# Patient Record
Sex: Male | Born: 1980 | Hispanic: Yes | Marital: Married | Smoking: Never smoker
Health system: Southern US, Community
[De-identification: ages and names within clinical notes are randomized; demographics above are authoritative.]

## PROBLEM LIST (undated history)

## (undated) DIAGNOSIS — I10 Essential (primary) hypertension: Secondary | ICD-10-CM

## (undated) DIAGNOSIS — E119 Type 2 diabetes mellitus without complications: Secondary | ICD-10-CM

---

## 2005-01-24 ENCOUNTER — Emergency Department: Payer: Self-pay | Admitting: Emergency Medicine

## 2005-02-15 ENCOUNTER — Emergency Department: Payer: Self-pay | Admitting: Emergency Medicine

## 2006-07-08 ENCOUNTER — Ambulatory Visit: Payer: Self-pay | Admitting: Family Medicine

## 2009-01-17 ENCOUNTER — Emergency Department: Payer: Self-pay | Admitting: Emergency Medicine

## 2014-03-20 ENCOUNTER — Ambulatory Visit: Payer: Self-pay | Admitting: Physician Assistant

## 2016-10-12 ENCOUNTER — Other Ambulatory Visit: Payer: Self-pay | Admitting: Orthopedic Surgery

## 2016-10-12 DIAGNOSIS — M25562 Pain in left knee: Secondary | ICD-10-CM

## 2016-10-18 ENCOUNTER — Other Ambulatory Visit: Payer: Self-pay | Admitting: Orthopedic Surgery

## 2016-10-18 DIAGNOSIS — M2392 Unspecified internal derangement of left knee: Secondary | ICD-10-CM

## 2016-10-18 DIAGNOSIS — M2352 Chronic instability of knee, left knee: Secondary | ICD-10-CM

## 2016-10-18 DIAGNOSIS — M25562 Pain in left knee: Secondary | ICD-10-CM

## 2016-10-22 ENCOUNTER — Ambulatory Visit
Admission: RE | Admit: 2016-10-22 | Discharge: 2016-10-22 | Disposition: A | Discharge status: Home / Self Care | Payer: PRIVATE HEALTH INSURANCE | Source: Ambulatory Visit | Attending: Orthopedic Surgery | Admitting: Orthopedic Surgery

## 2016-10-22 DIAGNOSIS — M2392 Unspecified internal derangement of left knee: Secondary | ICD-10-CM | POA: Diagnosis present

## 2016-10-22 DIAGNOSIS — M94262 Chondromalacia, left knee: Secondary | ICD-10-CM | POA: Diagnosis not present

## 2016-10-22 DIAGNOSIS — M2352 Chronic instability of knee, left knee: Secondary | ICD-10-CM

## 2016-10-22 DIAGNOSIS — M7138 Other bursal cyst, other site: Secondary | ICD-10-CM | POA: Insufficient documentation

## 2016-10-22 DIAGNOSIS — M25562 Pain in left knee: Secondary | ICD-10-CM

## 2016-12-03 DIAGNOSIS — M222X2 Patellofemoral disorders, left knee: Secondary | ICD-10-CM | POA: Insufficient documentation

## 2018-07-11 ENCOUNTER — Encounter: Payer: Self-pay | Admitting: *Deleted

## 2018-07-11 ENCOUNTER — Inpatient Hospital Stay
Admission: EM | Admit: 2018-07-11 | Discharge: 2018-07-15 | DRG: 392 | Disposition: A | Discharge status: Home / Self Care | Payer: PRIVATE HEALTH INSURANCE | Attending: Surgery | Admitting: Surgery

## 2018-07-11 ENCOUNTER — Other Ambulatory Visit: Payer: Self-pay

## 2018-07-11 ENCOUNTER — Emergency Department: Payer: PRIVATE HEALTH INSURANCE

## 2018-07-11 DIAGNOSIS — E119 Type 2 diabetes mellitus without complications: Secondary | ICD-10-CM | POA: Diagnosis present

## 2018-07-11 DIAGNOSIS — Z1159 Encounter for screening for other viral diseases: Secondary | ICD-10-CM

## 2018-07-11 DIAGNOSIS — K5732 Diverticulitis of large intestine without perforation or abscess without bleeding: Principal | ICD-10-CM | POA: Diagnosis present

## 2018-07-11 DIAGNOSIS — K5792 Diverticulitis of intestine, part unspecified, without perforation or abscess without bleeding: Secondary | ICD-10-CM | POA: Diagnosis not present

## 2018-07-11 DIAGNOSIS — R1032 Left lower quadrant pain: Secondary | ICD-10-CM | POA: Diagnosis not present

## 2018-07-11 DIAGNOSIS — Z794 Long term (current) use of insulin: Secondary | ICD-10-CM | POA: Diagnosis not present

## 2018-07-11 LAB — CBC
HCT: 45.1 % (ref 39.0–52.0)
Hemoglobin: 15.1 g/dL (ref 13.0–17.0)
MCH: 28.3 pg (ref 26.0–34.0)
MCHC: 33.5 g/dL (ref 30.0–36.0)
MCV: 84.6 fL (ref 80.0–100.0)
Platelets: 355 10*3/uL (ref 150–400)
RBC: 5.33 MIL/uL (ref 4.22–5.81)
RDW: 13.5 % (ref 11.5–15.5)
WBC: 14.7 10*3/uL — ABNORMAL HIGH (ref 4.0–10.5)
nRBC: 0 % (ref 0.0–0.2)

## 2018-07-11 LAB — URINALYSIS, COMPLETE (UACMP) WITH MICROSCOPIC
Bacteria, UA: NONE SEEN
Bilirubin Urine: NEGATIVE
Glucose, UA: >=500 mg/dL — AB
Hgb urine dipstick: NEGATIVE
Ketones, ur: 5 mg/dL — AB
Leukocytes,Ua: NEGATIVE
Nitrite: NEGATIVE
Protein, ur: NEGATIVE mg/dL
Specific Gravity, Urine: 1.021 (ref 1.005–1.030)
WBC, UA: NONE SEEN WBC/hpf (ref 0–5)
pH: 5 (ref 5.0–8.0)

## 2018-07-11 LAB — COMPREHENSIVE METABOLIC PANEL
ALT: 22 U/L (ref 0–44)
AST: 16 U/L (ref 15–41)
Albumin: 4.2 g/dL (ref 3.5–5.0)
Alkaline Phosphatase: 62 U/L (ref 38–126)
Anion gap: 11 (ref 5–15)
BUN: 16 mg/dL (ref 6–20)
CO2: 26 mmol/L (ref 22–32)
Calcium: 9.2 mg/dL (ref 8.9–10.3)
Chloride: 99 mmol/L (ref 98–111)
Creatinine, Ser: 0.71 mg/dL (ref 0.61–1.24)
GFR calc Af Amer: >60 mL/min (ref >60)
GFR calc non Af Amer: >60 mL/min (ref >60)
Glucose, Bld: 258 mg/dL — ABNORMAL HIGH (ref 70–99)
Potassium: 3.8 mmol/L (ref 3.5–5.1)
Sodium: 136 mmol/L (ref 135–145)
Total Bilirubin: 1.3 mg/dL — ABNORMAL HIGH (ref 0.3–1.2)
Total Protein: 8.1 g/dL (ref 6.5–8.1)

## 2018-07-11 LAB — GLUCOSE, CAPILLARY
Glucose-Capillary: 171 mg/dL — ABNORMAL HIGH (ref 70–99)
Glucose-Capillary: 125 mg/dL — ABNORMAL HIGH (ref 70–99)

## 2018-07-11 LAB — SARS CORONAVIRUS 2 BY RT PCR (HOSPITAL ORDER, PERFORMED IN ~~LOC~~ HOSPITAL LAB): SARS Coronavirus 2: NEGATIVE

## 2018-07-11 LAB — LIPASE, BLOOD: Lipase: 26 U/L (ref 11–51)

## 2018-07-11 MED ORDER — ONDANSETRON HCL 4 MG/2ML IJ SOLN
4.0000 mg | Freq: Once | INTRAMUSCULAR | Status: AC
  Administered 2018-07-11: 4 mg via INTRAVENOUS
  Filled 2018-07-11: qty 2

## 2018-07-11 MED ORDER — KETOROLAC TROMETHAMINE 30 MG/ML IJ SOLN
30.0000 mg | Freq: Four times a day (QID) | INTRAMUSCULAR | Status: DC
  Administered 2018-07-11 – 2018-07-15 (×14): 30 mg via INTRAVENOUS
  Filled 2018-07-11 (×15): qty 1

## 2018-07-11 MED ORDER — IOHEXOL 300 MG/ML  SOLN
100.0000 mL | Freq: Once | INTRAMUSCULAR | Status: AC | PRN
  Administered 2018-07-11: 100 mL via INTRAVENOUS

## 2018-07-11 MED ORDER — PIPERACILLIN-TAZOBACTAM 3.375 G IVPB
3.3750 g | Freq: Three times a day (TID) | INTRAVENOUS | Status: DC
  Administered 2018-07-11 – 2018-07-15 (×12): 3.375 g via INTRAVENOUS
  Filled 2018-07-11 (×12): qty 50

## 2018-07-11 MED ORDER — SODIUM CHLORIDE 0.9 % IV BOLUS
1000.0000 mL | Freq: Once | INTRAVENOUS | Status: AC
  Administered 2018-07-11: 1000 mL via INTRAVENOUS

## 2018-07-11 MED ORDER — POLYETHYLENE GLYCOL 3350 17 G PO PACK: 17.0000 g | PACK | Freq: Every day | ORAL | Status: DC | PRN

## 2018-07-11 MED ORDER — PIPERACILLIN-TAZOBACTAM 3.375 G IVPB 30 MIN
3.3750 g | Freq: Once | INTRAVENOUS | Status: AC
  Administered 2018-07-11: 3.375 g via INTRAVENOUS
  Filled 2018-07-11: qty 50

## 2018-07-11 MED ORDER — INSULIN ASPART 100 UNIT/ML ~~LOC~~ SOLN
0.0000 [IU] | SUBCUTANEOUS | Status: DC
  Administered 2018-07-11: 3 [IU] via SUBCUTANEOUS
  Administered 2018-07-11: 2 [IU] via SUBCUTANEOUS
  Administered 2018-07-12: 3 [IU] via SUBCUTANEOUS
  Administered 2018-07-12: 2 [IU] via SUBCUTANEOUS
  Administered 2018-07-12: 5 [IU] via SUBCUTANEOUS
  Administered 2018-07-12 – 2018-07-13 (×3): 3 [IU] via SUBCUTANEOUS
  Administered 2018-07-13: 2 [IU] via SUBCUTANEOUS
  Filled 2018-07-11 (×10): qty 1

## 2018-07-11 MED ORDER — HYDROMORPHONE HCL 1 MG/ML IJ SOLN
0.5000 mg | INTRAMUSCULAR | Status: DC | PRN
  Administered 2018-07-11 – 2018-07-12 (×3): 0.5 mg via INTRAVENOUS
  Filled 2018-07-11 (×4): qty 0.5

## 2018-07-11 MED ORDER — LACTATED RINGERS IV SOLN
125.0000 mL/h | INTRAVENOUS | Status: DC
  Administered 2018-07-11 – 2018-07-14 (×7): 125 mL/h via INTRAVENOUS

## 2018-07-11 MED ORDER — ONDANSETRON HCL 4 MG/2ML IJ SOLN: 4.0000 mg | Freq: Four times a day (QID) | INTRAMUSCULAR | Status: DC | PRN

## 2018-07-11 MED ORDER — ENOXAPARIN SODIUM 40 MG/0.4ML ~~LOC~~ SOLN
40.0000 mg | SUBCUTANEOUS | Status: DC
  Administered 2018-07-11 – 2018-07-14 (×4): 40 mg via SUBCUTANEOUS
  Filled 2018-07-11 (×4): qty 0.4

## 2018-07-11 MED ORDER — SODIUM CHLORIDE 0.9% FLUSH: 3.0000 mL | Freq: Once | INTRAVENOUS | Status: DC

## 2018-07-11 MED ORDER — PANTOPRAZOLE SODIUM 40 MG IV SOLR
40.0000 mg | Freq: Every day | INTRAVENOUS | Status: DC
  Administered 2018-07-11 – 2018-07-14 (×4): 40 mg via INTRAVENOUS
  Filled 2018-07-11 (×4): qty 40

## 2018-07-11 MED ORDER — MORPHINE SULFATE (PF) 4 MG/ML IV SOLN
4.0000 mg | Freq: Once | INTRAVENOUS | Status: AC
  Administered 2018-07-11: 4 mg via INTRAVENOUS
  Filled 2018-07-11: qty 1

## 2018-07-11 MED ORDER — ONDANSETRON 4 MG PO TBDP: 4.0000 mg | ORAL_TABLET | Freq: Four times a day (QID) | ORAL | Status: DC | PRN

## 2018-07-11 MED ORDER — HYDRALAZINE HCL 20 MG/ML IJ SOLN: 10.0000 mg | Freq: Four times a day (QID) | INTRAMUSCULAR | Status: DC | PRN

## 2018-07-11 NOTE — ED Notes (Signed)
.. ED TO INPATIENT HANDOFF REPORT  ED Nurse Name and Phone #: Pattricia Boss [phone]  S Name/Age/Gender Dennis Gray 38 y.o. male Room/Bed: ED16A/ED16A  Code Status   Code Status: Full Code  Home/SNF/Other Home Patient oriented to: self, place, time and situation Is this baseline? Yes   Triage Complete: Triage complete  Chief Complaint Abdominal pain, headache  Triage Note Pt ambulatory to triage.   Pt has left lower abd pain since yesterday.  Pt has intermittent nausea.  No vomiting.  No back pain.  Pt alert    Allergies No Known Allergies  Level of Care/Admitting Diagnosis ED Disposition    ED Disposition Condition Comment   Admit  Hospital Area: [facility] [[id number]]  Level of Care: Med-Surg [16]  Covid Evaluation: N/A  Diagnosis: Acute diverticulitis [[id number]]  Admitting Physician: Henrene Dodge [[id number]]  Attending Physician: Henrene Dodge [[id number]]  Estimated length of stay: 3 - 4 days  Certification:: I certify this patient will need inpatient services for at least 2 midnights  PT Class (Do Not Modify): Inpatient [101]  PT Acc Code (Do Not Modify): Private [1]       B Medical/Surgery History History reviewed. No pertinent past medical history.    A IV Location/Drains/Wounds Patient Lines/Drains/Airways Status   Active Line/Drains/Airways    Name:   Placement date:   Placement time:   Site:   Days:   Peripheral IV 07/11/18 Right Antecubital   07/11/18    0424    Antecubital   less than 1          Intake/Output Last 24 hours  Intake/Output Summary (Last 24 hours) at 07/11/2018 0710 Last data filed at 07/11/2018 5784 Gross per 24 hour  Intake 1050 ml  Output -  Net 1050 ml    Labs/Imaging Results for orders placed or performed during the hospital encounter of 07/11/18 (from the past 48 hour(s))  Lipase, blood     Status: None   Collection Time: 07/11/18  1:07 AM  Result Value Ref Range   Lipase 26 11 - 51 U/L    Comment:  Performed at [facility], [address] [place], [place] [address]  Comprehensive metabolic panel     Status: Abnormal   Collection Time: 07/11/18  1:07 AM  Result Value Ref Range   Sodium 136 135 - 145 mmol/L   Potassium 3.8 3.5 - 5.1 mmol/L   Chloride 99 98 - 111 mmol/L   CO2 26 22 - 32 mmol/L   Glucose, Bld 258 (H) 70 - 99 mg/dL   BUN 16 6 - 20 mg/dL   Creatinine, Ser 5.28 0.61 - 1.24 mg/dL   Calcium 9.2 8.9 - 41.3 mg/dL   Total Protein 8.1 6.5 - 8.1 g/dL   Albumin 4.2 3.5 - 5.0 g/dL   AST 16 15 - 41 U/L   ALT 22 0 - 44 U/L   Alkaline Phosphatase 62 38 - 126 U/L   Total Bilirubin 1.3 (H) 0.3 - 1.2 mg/dL   GFR calc non Af Amer >60 >60 mL/min   GFR calc Af Amer >60 >60 mL/min   Anion gap 11 5 - 15    Comment: Performed at [facility], [address] [place], [place] [address]  CBC     Status: Abnormal   Collection Time: 07/11/18  1:07 AM  Result Value Ref Range   WBC 14.7 (H) 4.0 - 10.5 K/uL   RBC 5.33 4.22 - 5.81 MIL/uL  Hemoglobin 15.1 13.0 - 17.0 g/dL   HCT 33.0 07.6 - 22.6 %   MCV 84.6 80.0 - 100.0 fL   MCH 28.3 26.0 - 34.0 pg   MCHC 33.5 30.0 - 36.0 g/dL   RDW 33.3 54.5 - 62.5 %   Platelets 355 150 - 400 K/uL   nRBC 0.0 0.0 - 0.2 %    Comment: Performed at [facility], [address] [place], [place] [address]  Urinalysis, Complete w Microscopic     Status: Abnormal   Collection Time: 07/11/18  1:07 AM  Result Value Ref Range   Color, Urine YELLOW (A) YELLOW   APPearance CLEAR (A) CLEAR   Specific Gravity, Urine 1.021 1.005 - 1.030   pH 5.0 5.0 - 8.0   Glucose, UA >=500 (A) NEGATIVE mg/dL   Hgb urine dipstick NEGATIVE NEGATIVE   Bilirubin Urine NEGATIVE NEGATIVE   Ketones, ur 5 (A) NEGATIVE mg/dL   Protein, ur NEGATIVE NEGATIVE mg/dL   Nitrite NEGATIVE NEGATIVE   Leukocytes,Ua NEGATIVE NEGATIVE   WBC, UA NONE SEEN 0 - 5 WBC/hpf   Bacteria, UA NONE SEEN NONE SEEN   Squamous Epithelial / LPF 0-5 0 - 5   Mucus PRESENT      Comment: Performed at [facility], [address], [place], [place] [address]   Ct Abdomen Pelvis W Contrast  Result Date: 07/11/2018 CLINICAL DATA:  38 year old male with left lower abdominal pain since yesterday with intermittent nausea. EXAM: CT ABDOMEN AND PELVIS WITH CONTRAST TECHNIQUE: Multidetector CT imaging of the abdomen and pelvis was performed using the standard protocol following bolus administration of intravenous contrast. CONTRAST:  OMNIPAQUE IOHEXOL 300 MG/ML  SOLN COMPARISON:  CT Abdomen and Pelvis 07/08/2006 and earlier. FINDINGS: Lower chest: Negative.  No pericardial or pleural effusion. Hepatobiliary: Possible hepatic steatosis. No discrete liver lesion. Negative gallbladder. Pancreas: Negative. Spleen: Negative. Adrenals/Urinary Tract: Normal adrenal glands. Symmetric bilateral renal enhancement and contrast excretion. Small right renal midpole cyst and 3-4 millimeter midpole calculus. Decompressed ureters. Decompressed and unremarkable urinary bladder. Stomach/Bowel: The rectum is decompressed and somewhat indistinct. The sigmoid colon is moderately to severely inflamed with wall thickening, mesenteric stranding, and an extraluminal heterogeneously enhancing mass or less likely within the mesentery of the sigmoid colon encompassing 42 by 43 x 30 millimeters. See series 2, images 67 through 70. There are diverticula upstream of the maximal area of sigmoid colon abnormality, but there is predominantly intermediate to solid density of the lesion within the mesentery. No extraluminal gas is identified. Regional soft tissue stranding without extraluminal fluid. There is proximal sigmoid diverticulitis or colitis in 2008. Mild to moderate diverticulosis throughout the nondilated descending colon upstream of the sigmoid. Occasional diverticula also in the transverse colon. Negative right colon, appendix, and terminal ileum. No dilated small bowel. Decompressed stomach. No free  air, free fluid. Vascular/Lymphatic: Major arterial structures are patent. No atherosclerosis identified. Portal venous system is patent. Sigmoid mesenteric lymph nodes outside of the discrete heterogeneously enhancing lesion are at the upper limits of normal, 7-8 millimeter short axis. Other abdominal and pelvic lymph nodes are stable and within normal limits. Reproductive: Negative. Other: No pelvic free fluid. Musculoskeletal: Multiple small endplate Schmorl's nodes in the spine. 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 No acute osseous abnormality identified. IMPRESSION: 1. Abnormal sigmoid colon which is indeterminate for diverticulitis/colitis with extraluminal phlegmon versus a Colon Cancer with secondary bowel inflammation. Note the 4.3 cm heterogeneously enhancing extraluminal mass-like lesion in the sigmoid  mesentery (coronal image 47). Diverticulosis in the region, and previous diverticulitis in 2008. Borderline regional lymph nodes. No associated bowel obstruction or perforation. 2. Possible hepatic steatosis, but no distant metastasis identified. 3. Right nephrolithiasis. Electronically Signed   By: Odessa FlemingH  Hall M.D.   On: 07/11/2018 04:59    Pending Labs Unresulted Labs (From admission, onward)    Start     Ordered   07/12/18 0500  Basic metabolic panel  Tomorrow morning,   STAT     07/11/18 0605   07/12/18 0500  Magnesium  Tomorrow morning,   STAT     07/11/18 0605   07/12/18 0500  CBC WITH DIFFERENTIAL  Tomorrow morning,   STAT     07/11/18 0605   07/11/18 0637  SARS Coronavirus 2 [facility](Hospital order, Performed in [facility] lab)  (Novel Coronavirus, [facility](Hospital Order))  Once,   STAT     07/11/18 0636   07/11/18 0600  HIV antibody (Routine Testing)  Once,   STAT     07/11/18 0605          Vitals/Pain Today's Vitals   07/11/18 0426 07/11/18 0600 07/11/18 0630 07/11/18 0654  BP:  (!) 140/91 (!) 147/94   Pulse:  89 88   Resp:   19   Temp:       TempSrc:      SpO2:  97% 99%   Weight:      Height:      PainSc: 9    4     Isolation Precautions No active isolations  Medications Medications  sodium chloride flush (NS) 0.9 % injection 3 mL (has no administration in time range)  lactated ringers infusion (has no administration in time range)  ketorolac (TORADOL) 30 MG/ML injection 30 mg (has no administration in time range)  HYDROmorphone (DILAUDID) injection 0.5 mg (has no administration in time range)  polyethylene glycol (MIRALAX / GLYCOLAX) packet 17 g (has no administration in time range)  ondansetron (ZOFRAN-ODT) disintegrating tablet 4 mg (has no administration in time range)    Or  ondansetron (ZOFRAN) injection 4 mg (has no administration in time range)  pantoprazole (PROTONIX) injection 40 mg (has no administration in time range)  enoxaparin (LOVENOX) injection 40 mg (has no administration in time range)  piperacillin-tazobactam (ZOSYN) IVPB 3.375 g (has no administration in time range)  hydrALAZINE (APRESOLINE) injection 10 mg (has no administration in time range)  iohexol (OMNIPAQUE) 300 MG/ML solution 100 mL (100 mLs Intravenous Contrast Given 07/11/18 0430)  sodium chloride 0.9 % bolus 1,000 mL (0 mLs Intravenous Stopped 07/11/18 0653)  piperacillin-tazobactam (ZOSYN) IVPB 3.375 g (0 g Intravenous Stopped 07/11/18 0653)  morphine 4 MG/ML injection 4 mg (4 mg Intravenous Given 07/11/18 0545)  ondansetron (ZOFRAN) injection 4 mg (4 mg Intravenous Given 07/11/18 0545)    Mobility walks Low fall risk   Focused Assessments GI   R Recommendations: See Admitting Provider Note  Report given to:   Additional Notes:
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
## 2018-07-11 NOTE — Progress Notes (Signed)
Inpatient Diabetes Program Recommendations  AACE/ADA: New Consensus Statement on Inpatient Glycemic Control   Target Ranges:  Prepandial:   less than 140 mg/dL      Peak postprandial:   less than 180 mg/dL (1-2 hours)      Critically ill patients:  140 - 180 mg/dL   Results for Dennis Gray, Dennis Gray (MRN [id number]) as of 07/11/2018 11:35  Ref. Range 07/11/2018 01:07  Glucose Latest Ref Range: 70 - 99 mg/dL 456 (H)   Review of Glycemic Control  Diabetes history: DM2 Outpatient Diabetes medications: Levemir 25 units daily, Novolog 25 units QHS, Glumetza 1000 mg BID, Glipizide 10 mg daily Current orders for Inpatient glycemic control: None   Inpatient Diabetes Program Recommendations:   Correction (SSI): Please consider ordering CBGs Q4H with Novolog 0-15 units Q4H.  Insulin - Basal: If glucose is consistently greater than 180 mg/dl with Novolog correction scale, may want to consider ordering low dose basal insulin (such as Levemir 10 units Q24H based on 93 kg x 0.1 units).  HbgA1C: Please consider ordering an A1C to evaluate glycemic control over the past 2-3 months.  Thanks, Orlando Penner, RN, MSN, CDE Diabetes Coordinator Inpatient Diabetes Program [phone] (Team Pager from 8am to 5pm)

## 2018-07-11 NOTE — ED Triage Notes (Addendum)
Pt ambulatory to triage.   Pt has left lower abd pain since yesterday.  Pt has intermittent nausea.  No vomiting.  No back pain.  Pt alert

## 2018-07-11 NOTE — H&P (Addendum)
[facility] SURGICAL HISTORY & PHYSICAL (cpt [phone])  HISTORY OF PRESENT ILLNESS (HPI):  38 y.o. male presented to [facility] ED this morning for abdominal pain. Patient reports sharp LLQ abdominal pain with initial onset yesterday. This pain has been gradually worsening since the onset. He notes associated subjective fever and nausea with the LLQ abdominal pain. No reports of chills, cough, CP, SOB, emesis, or bowel changees. He does report a history of similar pain in the past and appears to have been diagnosed with diverticulitis however this was in 2008. No reported family history of colon cancer. No previous colonoscopy. Work up in the ED was concerning for leukocytosis and CT concerning for possible diverticulitis with concern over possible inflammatory phlegmon vs possible malignant mass. These images were independently reviewed and discussed with reading radiologist by Dr. Aleen Campi, the on call surgeon overnight.   General surgery is consulted by emergency medicine physician Dr Olga Coaster, MD for evaluation and management of likely sigmoid diverticulitis.      PAST MEDICAL HISTORY (PMH):  History reviewed. No pertinent past medical history recorded, however medication list suggests diabetes.  Reviewed. Otherwise negative.   PAST SURGICAL HISTORY (PSH):  Reviewed. Otherwise negative.    MEDICATIONS:  Prior to Admission medications   Medication Sig Start Date End Date Taking? Authorizing Provider  insulin aspart (NOVOLOG) 100 UNIT/ML injection Inject 25 Units into the skin at bedtime.    Yes [provider]  insulin detemir (LEVEMIR) 100 UNIT/ML injection Inject 25 Units into the skin daily.   Yes [provider]  metFORMIN (GLUMETZA) 1000 MG (MOD) 24 hr tablet Take 1 tablet by mouth 2 (two) times a day. 05/11/18  Yes [provider]  gabapentin (NEURONTIN) 300 MG capsule Take 1 capsule by mouth 3 (three) times daily. 06/01/18   [provider]  glipiZIDE (GLUCOTROL) 5 MG tablet Take 10 mg by mouth daily. 01/22/09   [provider]     ALLERGIES:  No Known Allergies   SOCIAL HISTORY:  Social History   Socioeconomic History  . Marital status: Married    Spouse name: Not on file  . Number of children: Not on file  . Years of education: Not on file  . Highest education level: Not on file  Occupational History  . Not on file  Social Needs  . Financial resource strain: Not on file  . Food insecurity:    Worry: Not on file    Inability: Not on file  . Transportation needs:    Medical: Not on file    Non-medical: Not on file  Tobacco Use  . Smoking status: Never Smoker  . Smokeless tobacco: Never Used  Substance and Sexual Activity  . Alcohol use: Not Currently  . Drug use: Not Currently  . Sexual activity: Not on file  Lifestyle  . Physical activity:    Days per week: Not on file    Minutes per session: Not on file  . Stress: Not on file  Relationships  . Social connections:    Talks on phone: Not on file    Gets together: Not on file    Attends religious service: Not on file    Active member of club or organization: Not on file    Attends meetings of clubs or organizations: Not on file    Relationship status: Not on file  . Intimate partner violence:    Fear of current or ex partner: Not on file    Emotionally abused:  Not on file    Physically abused: Not on file    Forced sexual activity: Not on file  Other Topics Concern  . Not on file  Social History Narrative  . Not on file     FAMILY HISTORY:  No family history on file.  Otherwise negative.   REVIEW OF SYSTEMS:  Review of Systems  Constitutional: Positive for fever (Subjective). Negative for chills.  Respiratory: Negative for cough and shortness of breath.   Cardiovascular: Negative for chest pain and palpitations.  Gastrointestinal: Positive for abdominal pain and nausea. Negative for blood in stool, diarrhea and vomiting.   Genitourinary: Negative for dysuria and urgency.  Neurological: Negative for dizziness and headaches.  All other systems reviewed and are negative.   VITAL SIGNS:  Temp:  [98.4 F (36.9 C)-99.7 F (37.6 C)] 98.4 F (36.9 C) (05/12 0843) Pulse Rate:  [88-113] 88 (05/12 0804) Resp:  [18-20] 18 (05/12 0843) BP: (134-162)/(91-98) 134/96 (05/12 0843) SpO2:  [97 %-100 %] 99 % (05/12 0843) Weight:  [93 kg] 93 kg (05/12 0104)     Height: 5\' 6"  (167.6 cm) Weight: 93 kg BMI (Calculated): 33.1   PHYSICAL EXAM:  Physical Exam Vitals signs and nursing note reviewed.  Constitutional:      General: He is not in acute distress.    Appearance: He is well-developed. He is obese. He is not ill-appearing.  HENT:     Head: Normocephalic and atraumatic.  Eyes:     General: No scleral icterus.    Extraocular Movements: Extraocular movements intact.  Cardiovascular:     Rate and Rhythm: Normal rate and regular rhythm.     Heart sounds: Normal heart sounds. No murmur. No friction rub. No gallop.   Pulmonary:     Effort: Pulmonary effort is normal. No respiratory distress.     Breath sounds: Normal breath sounds. No wheezing or rhonchi.  Abdominal:     General: Abdomen is flat. There is no distension.     Tenderness: There is abdominal tenderness in the left lower quadrant. There is no guarding or rebound.  Genitourinary:    Comments: Deferred Skin:    General: Skin is warm and dry.     Coloration: Skin is not jaundiced or pale.  Neurological:     General: No focal deficit present.     Mental Status: He is alert and oriented to person, place, and time.  Psychiatric:        Mood and Affect: Mood normal.        Behavior: Behavior normal.     INTAKE/OUTPUT:  This shift: No intake/output data recorded.  Last 2 shifts: @IOLAST2SHIFTS @  Labs:  CBC Latest Ref Rng & Units 07/11/2018  WBC 4.0 - 10.5 K/uL 14.7(H)  Hemoglobin 13.0 - 17.0 g/dL 16.115.1  Hematocrit 09.639.0 - 52.0 % 45.1  Platelets 150  - 400 K/uL 355   CMP Latest Ref Rng & Units 07/11/2018  Glucose 70 - 99 mg/dL 045(W258(H)  BUN 6 - 20 mg/dL 16  Creatinine 0.980.61 - 1.191.24 mg/dL 1.470.71  Sodium 829135 - 562145 mmol/L 136  Potassium 3.5 - 5.1 mmol/L 3.8  Chloride 98 - 111 mmol/L 99  CO2 22 - 32 mmol/L 26  Calcium 8.9 - 10.3 mg/dL 9.2  Total Protein 6.5 - 8.1 g/dL 8.1  Total Bilirubin 0.3 - 1.2 mg/dL 1.3(Y1.3(H)  Alkaline Phos 38 - 126 U/L 62  AST 15 - 41 U/L 16  ALT 0 - 44 U/L 22  Imaging studies:   CT Abdomen/Pelvis (07/11/2018) personally reviewed and discussed with reading radiologist via telephone, he does have concern for malignancy in this region given the appearance of the mass although it is entirely possible to be inflammatory changes or contained perforation. Discussed recommendation for follow up care, consider colonoscopy and possible re-imaging once clinically improved to further assess this area of concern.   IMPRESSION: 1. Abnormal sigmoid colon which is indeterminate for diverticulitis/colitis with extraluminal phlegmon versus a Colon Cancer with secondary bowel inflammation. Note the 4.3 cm heterogeneously enhancing extraluminal mass-like lesion in the sigmoid mesentery (coronal image 47). Diverticulosis in the region, and previous diverticulitis in 2008. Borderline regional lymph nodes. No associated bowel obstruction or perforation. 2. Possible hepatic steatosis, but no distant metastasis identified. 3. Right nephrolithiasis.     Assessment/Plan: (ICD-10's: K74.92) 38 y.o. male with leukocytosis and LLQ abdominal pain which is likely attributable to acute sigmoid diverticulitis with area of likely inflammatory changes, although malignancy can not be excluded per radiologist discussion   - Admit to general surgery  - NPO + IVF  - IV ABx (Zosyn); Day 1  - Pain control prn; antiemetics prn  - Monitor abdominal examination; on-going bowel function  - No emergent need for surgical intervention  - Will likely benefit  from outpatient colectomy given recurrence of diverticulitis.   - Discussed follow up recommendations regarding his inflammatory vs malignant changes on CT. Should consider colonoscopy vs re-imaging once through the acute inflammatory portion of his illness.   - Medical management of comorbidities  - mobilization encouraged    - DVT prophylaxis  All of the above findings and recommendations were discussed with the patient, and all of his questions were answered to his expressed satisfaction.  -- Lynden Oxford, PA-C [facility] 07/11/2018, 10:35 AM [phone] M-F: 7am - 4pm  I saw and evaluated the patient.  I agree with the above documentation, exam, and plan, which I have edited where appropriate. Duanne Guess  11:18 AM

## 2018-07-11 NOTE — ED Notes (Signed)
Patient transported to CT 

## 2018-07-12 LAB — CBC WITH DIFFERENTIAL/PLATELET
Abs Immature Granulocytes: 0.04 10*3/uL (ref 0.00–0.07)
Basophils Absolute: 0.1 10*3/uL (ref 0.0–0.1)
Basophils Relative: 1 %
Eosinophils Absolute: 0.2 10*3/uL (ref 0.0–0.5)
Eosinophils Relative: 2 %
HCT: 39.2 % (ref 39.0–52.0)
Hemoglobin: 12.9 g/dL — ABNORMAL LOW (ref 13.0–17.0)
Immature Granulocytes: 0 %
Lymphocytes Relative: 21 %
Lymphs Abs: 2.2 10*3/uL (ref 0.7–4.0)
MCH: 28.2 pg (ref 26.0–34.0)
MCHC: 32.9 g/dL (ref 30.0–36.0)
MCV: 85.6 fL (ref 80.0–100.0)
Monocytes Absolute: 1 10*3/uL (ref 0.1–1.0)
Monocytes Relative: 10 %
Neutro Abs: 7 10*3/uL (ref 1.7–7.7)
Neutrophils Relative %: 66 %
Platelets: 302 10*3/uL (ref 150–400)
RBC: 4.58 MIL/uL (ref 4.22–5.81)
RDW: 13.7 % (ref 11.5–15.5)
WBC: 10.5 10*3/uL (ref 4.0–10.5)
nRBC: 0 % (ref 0.0–0.2)

## 2018-07-12 LAB — GLUCOSE, CAPILLARY
Glucose-Capillary: 114 mg/dL — ABNORMAL HIGH (ref 70–99)
Glucose-Capillary: 115 mg/dL — ABNORMAL HIGH (ref 70–99)
Glucose-Capillary: 134 mg/dL — ABNORMAL HIGH (ref 70–99)
Glucose-Capillary: 153 mg/dL — ABNORMAL HIGH (ref 70–99)
Glucose-Capillary: 180 mg/dL — ABNORMAL HIGH (ref 70–99)
Glucose-Capillary: 211 mg/dL — ABNORMAL HIGH (ref 70–99)
Glucose-Capillary: 85 mg/dL (ref 70–99)

## 2018-07-12 LAB — BASIC METABOLIC PANEL
Anion gap: 8 (ref 5–15)
BUN: 17 mg/dL (ref 6–20)
CO2: 27 mmol/L (ref 22–32)
Calcium: 8.8 mg/dL — ABNORMAL LOW (ref 8.9–10.3)
Chloride: 106 mmol/L (ref 98–111)
Creatinine, Ser: 0.74 mg/dL (ref 0.61–1.24)
GFR calc Af Amer: >60 mL/min (ref >60)
GFR calc non Af Amer: >60 mL/min (ref >60)
Glucose, Bld: 140 mg/dL — ABNORMAL HIGH (ref 70–99)
Potassium: 4.1 mmol/L (ref 3.5–5.1)
Sodium: 141 mmol/L (ref 135–145)

## 2018-07-12 LAB — MAGNESIUM: Magnesium: 2.2 mg/dL (ref 1.7–2.4)

## 2018-07-12 NOTE — Progress Notes (Signed)
[facility] PROGRESS NOTE (cpt [phone])  Hospital Day(s): 1.   Post op day(s):  Marland Kitchen   Interval History: Patient seen and examined, no acute events or new complaints overnight. Patient reports that he is still having LLQ abdominal pain but this is improved from presentation. He does endorse some associated nausea with the pain, but denies fever, chills, cough, congestion, CP, SOB, emesis. He denied passing flatus. Last BM was Monday. He has been ambulating around the room.   Review of Systems:  Constitutional: denies fever, chills  HEENT: denies cough or congestion  Respiratory: denies any shortness of breath  Cardiovascular: denies chest pain or palpitations  Gastrointestinal: + abdominal pain (LLQ), + Nausea, denied Vomiting, or diarrhea/and bowel function as per interval history Genitourinary: denies burning with urination or urinary frequency   Vital signs in last 24 hours: [min-max] current  Temp:  [98 F (36.7 C)-98.6 F (37 C)] 98 F (36.7 C) (05/13 0400) Pulse Rate:  [66-88] 66 (05/13 0400) Resp:  [15-18] 16 (05/13 0400) BP: (108-161)/(70-98) 116/81 (05/13 0400) SpO2:  [99 %-100 %] 100 % (05/13 0400)     Height: 5\' 6"  (167.6 cm) Weight: 93 kg BMI (Calculated): 33.1   Intake/Output this shift:  No intake/output data recorded.     Physical Exam:  Constitutional: alert, cooperative and no distress  HENT: normocephalic without obvious abnormality  Eyes: PERRL, EOM's grossly intact and symmetric  Respiratory: breathing non-labored at rest  Cardiovascular: regular rate and sinus rhythm  Gastrointestinal: soft, LLQ tenderness, and non-distended, no rebound/guarding Musculoskeletal: no edema or wounds, motor and sensation grossly intact, NT    Labs:  CBC Latest Ref Rng & Units 07/12/2018 07/11/2018  WBC 4.0 - 10.5 K/uL 10.5 14.7(H)  Hemoglobin 13.0 - 17.0 g/dL 12.9(L) 15.1  Hematocrit 39.0 - 52.0 % 39.2 45.1  Platelets 150 - 400 K/uL 302 355   CMP  Latest Ref Rng & Units 07/12/2018 07/11/2018  Glucose 70 - 99 mg/dL 604(V) 409(W)  BUN 6 - 20 mg/dL 17 16  Creatinine 1.19 - 1.24 mg/dL 1.47 8.29  Sodium 562 - 145 mmol/L 141 136  Potassium 3.5 - 5.1 mmol/L 4.1 3.8  Chloride 98 - 111 mmol/L 106 99  CO2 22 - 32 mmol/L 27 26  Calcium 8.9 - 10.3 mg/dL 1.3(Y) 9.2  Total Protein 6.5 - 8.1 g/dL - 8.1  Total Bilirubin 0.3 - 1.2 mg/dL - 1.3(H)  Alkaline Phos 38 - 126 U/L - 62  AST 15 - 41 U/L - 16  ALT 0 - 44 U/L - 22    Imaging studies: No new pertinent imaging studies   Assessment/Plan: (ICD-10's: K36.92) 38 y.o. 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              -  Discussed follow up recommendations regarding his inflammatory vs malignant changes on CT. Should consider colonoscopy vs re-imaging once through the acute inflammatory portion of his illness.              - Medical management of comorbidities             - mobilization encouraged               - DVT prophylaxis  All of the above findings and recommendations were discussed with the patient, and all of his questions were answered to his expressed satisfaction.   -- Lynden OxfordZachary Schulz, PA-C [facility] 07/12/2018, 7:35 AM [phone] M-F: 7am - 4pm

## 2018-07-13 LAB — CBC
HCT: 37.5 % — ABNORMAL LOW (ref 39.0–52.0)
Hemoglobin: 12.2 g/dL — ABNORMAL LOW (ref 13.0–17.0)
MCH: 28 pg (ref 26.0–34.0)
MCHC: 32.5 g/dL (ref 30.0–36.0)
MCV: 86 fL (ref 80.0–100.0)
Platelets: 311 10*3/uL (ref 150–400)
RBC: 4.36 MIL/uL (ref 4.22–5.81)
RDW: 13.3 % (ref 11.5–15.5)
WBC: 9.1 10*3/uL (ref 4.0–10.5)
nRBC: 0 % (ref 0.0–0.2)

## 2018-07-13 LAB — HIV ANTIBODY (ROUTINE TESTING W REFLEX): HIV Screen 4th Generation wRfx: NONREACTIVE

## 2018-07-13 LAB — GLUCOSE, CAPILLARY
Glucose-Capillary: 110 mg/dL — ABNORMAL HIGH (ref 70–99)
Glucose-Capillary: 143 mg/dL — ABNORMAL HIGH (ref 70–99)
Glucose-Capillary: 160 mg/dL — ABNORMAL HIGH (ref 70–99)
Glucose-Capillary: 167 mg/dL — ABNORMAL HIGH (ref 70–99)
Glucose-Capillary: 229 mg/dL — ABNORMAL HIGH (ref 70–99)
Glucose-Capillary: 215 mg/dL — ABNORMAL HIGH (ref 70–99)

## 2018-07-13 MED ORDER — INSULIN ASPART 100 UNIT/ML ~~LOC~~ SOLN
0.0000 [IU] | Freq: Three times a day (TID) | SUBCUTANEOUS | Status: DC
  Administered 2018-07-14: 2 [IU] via SUBCUTANEOUS
  Administered 2018-07-14: 5 [IU] via SUBCUTANEOUS
  Administered 2018-07-14: 11 [IU] via SUBCUTANEOUS
  Administered 2018-07-15: 3 [IU] via SUBCUTANEOUS
  Filled 2018-07-13 (×4): qty 1

## 2018-07-13 MED ORDER — INSULIN ASPART 100 UNIT/ML ~~LOC~~ SOLN
0.0000 [IU] | Freq: Every day | SUBCUTANEOUS | Status: DC
  Administered 2018-07-13 – 2018-07-14 (×2): 2 [IU] via SUBCUTANEOUS
  Filled 2018-07-13: qty 1

## 2018-07-13 NOTE — Progress Notes (Signed)
[facility] PROGRESS NOTE (cpt [phone])  Hospital Day(s): 2.   Post op day(s):  Dennis Gray   Interval History: Patient seen and examined, no acute events or new complaints overnight. Patient reports that he is feeling okay this morning still with some LLQ discomfort. No fever, chills, nausea, or emesis. He is tolerating clear liquids. Mobilizing well. No other complaints.   Review of Systems:  Constitutional: denies fever, chills   Respiratory: denies any shortness of breath  Cardiovascular: denies chest pain or palpitations  Gastrointestinal: + abdominal pain (LLQ), denies N/V, or diarrhea/and bowel function as per interval history Genitourinary: denies burning with urination or urinary frequency   Vital signs in last 24 hours: [min-max] current  Temp:  [98.1 F (36.7 C)-98.8 F (37.1 C)] 98.1 F (36.7 C) (05/14 0413) Pulse Rate:  [68-74] 68 (05/14 0413) Resp:  [14-20] 20 (05/14 0413) BP: (118-123)/(77-84) 118/77 (05/14 0413) SpO2:  [96 %-100 %] 100 % (05/14 0413)     Height: 5\' 6"  (167.6 cm) Weight: 93 kg BMI (Calculated): 33.1   Intake/Output this shift:  Total I/O In: 540 [P.O.:540] Out: -      Physical Exam:  Constitutional: alert, cooperative and no distress  HENT: normocephalic without obvious abnormality  Eyes: PERRL, EOM's grossly intact and symmetric  Respiratory: breathing non-labored at rest  Cardiovascular: regular rate and sinus rhythm  Gastrointestinal: soft, LLQ tenderness, and non-distended, no rebound/guarding. No peritonitis Musculoskeletal: no edema or wounds, motor and sensation grossly intact, NT    Labs:  CBC Latest Ref Rng & Units 07/13/2018 07/12/2018 07/11/2018  WBC 4.0 - 10.5 K/uL 9.1 10.5 14.7(H)  Hemoglobin 13.0 - 17.0 g/dL 12.2(L) 12.9(L) 15.1  Hematocrit 39.0 - 52.0 % 37.5(L) 39.2 45.1  Platelets 150 - 400 K/uL 311 302 355   CMP Latest Ref Rng & Units 07/12/2018 07/11/2018  Glucose 70 - 99 mg/dL 536(U) 440(H)  BUN 6 - 20  mg/dL 17 16  Creatinine 4.74 - 1.24 mg/dL 2.59 5.63  Sodium 875 - 145 mmol/L 141 136  Potassium 3.5 - 5.1 mmol/L 4.1 3.8  Chloride 98 - 111 mmol/L 106 99  CO2 22 - 32 mmol/L 27 26  Calcium 8.9 - 10.3 mg/dL 6.4(P) 9.2  Total Protein 6.5 - 8.1 g/dL - 8.1  Total Bilirubin 0.3 - 1.2 mg/dL - 1.3(H)  Alkaline Phos 38 - 126 U/L - 62  AST 15 - 41 U/L - 16  ALT 0 - 44 U/L - 22     Imaging studies: No new pertinent imaging studies   Assessment/Plan: (ICD-10's: K77.92) 38 y.o. 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   - No emergent need for surgical intervention currently; will continue to reassess - Will likely benefit from outpatient colectomy given recurrence of diverticulitis.  - Discussed follow up recommendations regarding his inflammatory vs malignant changes on CT. Should consider colonoscopy vs re-imaging once through the acute inflammatory portion of hisillness.  - Medical management of comorbidities - mobilization encouraged - DVT prophylaxis  All of the above findings and recommendations were discussed with the patient, and all ofhisquestions were answered to hisexpressed satisfaction.   --  Lynden OxfordZachary , PA-C [facility] 07/13/2018, 10:33 AM [phone] M-F: 7am - 4pm

## 2018-07-13 NOTE — Progress Notes (Signed)
Called the on call MD Dr. Tonna Boehringer to see if we could change to FSBS orders to [facility].  He gave me the order to change the FS orders and insulin coverage

## 2018-07-13 NOTE — Progress Notes (Signed)
Pt seen and examined w MR. [facility] PAC. Improving very slowly. WBC nml. We wil advance diet slowly, may benefit from CT scan tomorrow if sxs persist or if wbc goes up. No need for emergent surgical intervention at this time

## 2018-07-14 LAB — CBC
HCT: 38.4 % — ABNORMAL LOW (ref 39.0–52.0)
Hemoglobin: 12.8 g/dL — ABNORMAL LOW (ref 13.0–17.0)
MCH: 27.9 pg (ref 26.0–34.0)
MCHC: 33.3 g/dL (ref 30.0–36.0)
MCV: 83.8 fL (ref 80.0–100.0)
Platelets: 340 10*3/uL (ref 150–400)
RBC: 4.58 MIL/uL (ref 4.22–5.81)
RDW: 12.9 % (ref 11.5–15.5)
WBC: 9.8 10*3/uL (ref 4.0–10.5)
nRBC: 0 % (ref 0.0–0.2)

## 2018-07-14 LAB — GLUCOSE, CAPILLARY
Glucose-Capillary: 137 mg/dL — ABNORMAL HIGH (ref 70–99)
Glucose-Capillary: 304 mg/dL — ABNORMAL HIGH (ref 70–99)
Glucose-Capillary: 213 mg/dL — ABNORMAL HIGH (ref 70–99)
Glucose-Capillary: 244 mg/dL — ABNORMAL HIGH (ref 70–99)

## 2018-07-14 MED ORDER — LACTATED RINGERS IV SOLN
INTRAVENOUS | Status: DC
  Administered 2018-07-14: 09:00:00 via INTRAVENOUS

## 2018-07-14 NOTE — Progress Notes (Signed)
Inpatient Diabetes Program Recommendations  AACE/ADA: New Consensus Statement on Inpatient Glycemic Control (2015)  Target Ranges:  Prepandial:   less than 140 mg/dL      Peak postprandial:   less than 180 mg/dL (1-2 hours)      Critically ill patients:  140 - 180 mg/dL   Lab Results  Component Value Date   GLUCAP 304 (H) 07/14/2018   Home medications per medication reconciliation: Glucotrol 10 mg daily, Levemir 25 units daily, Metformin 1000 mg bid, Novolog 25 units q evening  Hospital meds:  Novolog moderate tid with meals and HS  Page received from [place] regarding patient's insulin regimen. I called and spoke with patient. He states that he took Levemir 25 units daily and Novolog 25 units q evening(as stated on medication reconciliation).  He does not appear to need this much insulin at d/c.    At discharge, consider d/c of Novolog insulin at bedtime (he will need to f/u with PCP regarding this medication prior to restart) in order to prevent hypoglycemia.  Also consider reduction of Levemir to 20 units daily at d/c.  He should be able to resume oral DM medications Metformin and Glucotrol.    Thanks so much,   Beryl Meager, RN, BC-ADM Inpatient Diabetes Coordinator Pager [phone] (8a-5p)

## 2018-07-14 NOTE — Progress Notes (Signed)
[facility] PROGRESS NOTE (cpt [phone])  Hospital Day(s): 3.   Post op day(s):  Marland Kitchen   Interval History: Patient seen and examined, no acute events or new complaints overnight. Patient reports resolution in pain without fever, chill, nausea, or emesis. He tolerated full liquids without issue. Still with bowel function. Mobilizing well.   Review of Systems:  Constitutional: denies fever, chills  Respiratory: denies any shortness of breath  Cardiovascular: denies chest pain or palpitations  Gastrointestinal: denies abdominal pain, N/V, or diarrhea/and bowel function as per interval history Genitourinary: denies burning with urination or urinary frequency   Vital signs in last 24 hours: [min-max] current  Temp:  [98.4 F (36.9 C)-99.1 F (37.3 C)] 98.9 F (37.2 C) (05/15 0425) Pulse Rate:  [70-81] 73 (05/15 0425) Resp:  [16-20] 20 (05/15 0425) BP: (119-148)/(80-93) 120/85 (05/15 0425) SpO2:  [100 %] 100 % (05/15 0425)     Height: 5\' 6"  (167.6 cm) Weight: 93 kg BMI (Calculated): 33.1   Intake/Output this shift:  Total I/O In: 480 [P.O.:480] Out: -     Physical Exam:  Constitutional: alert, cooperative and no distress  HENT: normocephalic without obvious abnormality  Respiratory: breathing non-labored at rest  Cardiovascular: regular rate and sinus rhythm  Gastrointestinal: soft, non-tender, and non-distended. No rebound/guarding Musculoskeletal: no edema or wounds, motor and sensation grossly intact, NT    Labs:  CBC Latest Ref Rng & Units 07/14/2018 07/13/2018 07/12/2018  WBC 4.0 - 10.5 K/uL 9.8 9.1 10.5  Hemoglobin 13.0 - 17.0 g/dL 12.8(L) 12.2(L) 12.9(L)  Hematocrit 39.0 - 52.0 % 38.4(L) 37.5(L) 39.2  Platelets 150 - 400 K/uL 340 311 302   CMP Latest Ref Rng & Units 07/12/2018 07/11/2018  Glucose 70 - 99 mg/dL 235(T) 614(E)  BUN 6 - 20 mg/dL 17 16  Creatinine 3.15 - 1.24 mg/dL 4.00 8.67  Sodium 619 - 145 mmol/L 141 136  Potassium 3.5 - 5.1 mmol/L  4.1 3.8  Chloride 98 - 111 mmol/L 106 99  CO2 22 - 32 mmol/L 27 26  Calcium 8.9 - 10.3 mg/dL 5.0(D) 9.2  Total Protein 6.5 - 8.1 g/dL - 8.1  Total Bilirubin 0.3 - 1.2 mg/dL - 1.3(H)  Alkaline Phos 38 - 126 U/L - 62  AST 15 - 41 U/L - 16  ALT 0 - 44 U/L - 22     Imaging studies: No new pertinent imaging studies   Assessment/Plan: (ICD-10's: K51.92) 38 y.o. 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 with area of likely inflammatory changes, although malignancy can not be excluded per radiologist discussion   - Soft diet +discontinue IVF  - Continue IV ABX (Zosyn - Day 3)  - Pain control prn; antiemetics prn  - Monitor abdominal examination; on-going bowel function   - Medical management of comorbidities   - mobilization encouraged  - DVT prophylaxis    - Discharge planning: Hopefully home tomorrow, ABx (Augmentin) x 10 days, follow up with Dr Aleen Campi in 2 weeks  All of the above findings and recommendations were discussed with the patient, and the medical team, and all of patient's questions were answered to his expressed satisfaction.  -- Lynden Oxford, PA-C [facility] 07/14/2018, 10:11 AM [phone] M-F: 7am - 4pm

## 2018-07-15 LAB — GLUCOSE, CAPILLARY: Glucose-Capillary: 181 mg/dL — ABNORMAL HIGH (ref 70–99)

## 2018-07-15 MED ORDER — AMOXICILLIN-POT CLAVULANATE 875-125 MG PO TABS: 1.0000 | ORAL_TABLET | Freq: Two times a day (BID) | ORAL | 0 refills | Status: AC

## 2018-07-15 MED ORDER — HYDROCODONE-ACETAMINOPHEN 5-325 MG PO TABS: 1.0000 | ORAL_TABLET | ORAL | 0 refills | Status: AC | PRN

## 2018-07-15 MED ORDER — AMOXICILLIN-POT CLAVULANATE 875-125 MG PO TABS: 1.0000 | ORAL_TABLET | Freq: Two times a day (BID) | ORAL | 0 refills | Status: DC

## 2018-07-15 MED ORDER — HYDROCODONE-ACETAMINOPHEN 5-325 MG PO TABS: 1.0000 | ORAL_TABLET | ORAL | 0 refills | Status: DC | PRN

## 2018-07-15 NOTE — Discharge Instructions (Signed)
°  Diet: Resume home heart healthy low fiber diet for the next 2 weeks.   Activity: Increase activity as tolerated, light activity and walking are encouraged. Do not drive or drink alcohol if taking narcotic pain medications.  Medications: Resume all home medications.  Take antibiotic as prescribed.  For mild to moderate pain: acetaminophen (Tylenol) or ibuprofen (if no kidney disease). Combining Tylenol with alcohol can substantially increase your risk of causing liver disease. Narcotic pain medications, if prescribed, can be used for severe pain, though may cause nausea, constipation, and drowsiness. Do not combine Tylenol and Norco within a 6 hour period as Norco contains Tylenol. If you do not need the narcotic pain medication, you do not need to fill the prescription.  Call office  [phone] at any time if any questions, worsening pain, fevers/chills, bleeding, drainage from incision site, or other concerns.

## 2018-07-15 NOTE — Discharge Summary (Signed)
  Patient ID: Dennis Gray MRN: [id number] DOB/AGE: 1980-10-27 38 y.o.  Admit date: 07/11/2018 Discharge date: 07/15/2018   Discharge Diagnoses:  Active Problems:   Acute diverticulitis   Procedures: None  Hospital Course: Patient admitted with acute diverticulitis with suspected abscess.  Initially he had persistent leukocytosis and abdominal pain but the last few days the white blood cell count decreases to normal and the pain will slowly been controlled.  Yesterday he ate salmon and he tolerated without abdominal pain.  He has been receiving IV antibiotic therapy.  He is passing gas.  He is ambulating.  He has not had any fever or tachycardia.  Physical exam is unremarkable with just mild soreness on left lower quadrant.  Physical Exam  Constitutional: He is oriented to person, place, and time and well-developed, well-nourished, and in no distress.  HENT:  Head: Normocephalic.  Neck: Normal range of motion.  Cardiovascular: Normal rate and regular rhythm.  Pulmonary/Chest: Effort normal.  Abdominal: Soft. Bowel sounds are normal. He exhibits no distension.  Musculoskeletal: Normal range of motion.  Neurological: He is alert and oriented to person, place, and time.  Skin: Skin is warm.     Consults: None  Disposition: Discharge disposition: 01-Home or Self Care       Discharge Instructions    Increase activity slowly   Complete by:  As directed      Allergies as of 07/15/2018   No Known Allergies     Medication List    TAKE these medications   amoxicillin-clavulanate 875-125 MG tablet Commonly known as:  Augmentin Take 1 tablet by mouth 2 (two) times daily for 10 days.   gabapentin 300 MG capsule Commonly known as:  NEURONTIN Take 1 capsule by mouth 3 (three) times daily.   glipiZIDE 5 MG tablet Commonly known as:  GLUCOTROL Take 10 mg by mouth daily.   HYDROcodone-acetaminophen 5-325 MG tablet Commonly known as:  Norco Take 1 tablet by mouth  every 4 (four) hours as needed for up to 3 days for moderate pain.   insulin aspart 100 UNIT/ML injection Commonly known as:  novoLOG Inject 25 Units into the skin at bedtime.   insulin detemir 100 UNIT/ML injection Commonly known as:  LEVEMIR Inject 25 Units into the skin daily.   metFORMIN 1000 MG (MOD) 24 hr tablet Commonly known as:  GLUMETZA Take 1 tablet by mouth 2 (two) times a day.      Follow-up Information    Henrene Dodge, MD. Go on 07/28/2018.   Specialty:  General Surgery Why:  @10 :45 am, hospital follow up, diverticulitis Contact information: [address] [place] [address] [phone]          This discharge encounter was more than 30 minutes most of the time counseling patient about future diet, antibiotic therapy and coordinating plan of care.

## 2018-07-15 NOTE — Progress Notes (Signed)
07/15/2018 9:47 AM  Dennis Gray to be D/C'd Home per MD order.  Discussed prescriptions and follow up appointments with the patient. Prescriptions given to patient, medication list explained in detail. Pt verbalized understanding.  Allergies as of 07/15/2018   No Known Allergies     Medication List    TAKE these medications   amoxicillin-clavulanate 875-125 MG tablet Commonly known as:  Augmentin Take 1 tablet by mouth 2 (two) times daily for 10 days.   gabapentin 300 MG capsule Commonly known as:  NEURONTIN Take 1 capsule by mouth 3 (three) times daily.   glipiZIDE 5 MG tablet Commonly known as:  GLUCOTROL Take 10 mg by mouth daily.   HYDROcodone-acetaminophen 5-325 MG tablet Commonly known as:  Norco Take 1 tablet by mouth every 4 (four) hours as needed for up to 3 days for moderate pain.   insulin aspart 100 UNIT/ML injection Commonly known as:  novoLOG Inject 25 Units into the skin at bedtime.   insulin detemir 100 UNIT/ML injection Commonly known as:  LEVEMIR Inject 25 Units into the skin daily.   metFORMIN 1000 MG (MOD) 24 hr tablet Commonly known as:  GLUMETZA Take 1 tablet by mouth 2 (two) times a day.       Vitals:   07/14/18 1946 07/15/18 0438  BP: 132/87 130/86  Pulse: 76 70  Resp: 16 20  Temp: 99.5 F (37.5 C) 99.1 F (37.3 C)  SpO2: 98% 98%    Skin clean, dry and intact without evidence of skin break down, no evidence of skin tears noted. IV catheter discontinued intact. Site without signs and symptoms of complications. Dressing and pressure applied. Pt denies pain at this time. No complaints noted.  An After Visit Summary was printed and given to the patient. Patient escorted via WC, and D/C home via private auto.  [name]

## 2018-07-15 NOTE — Final Progress Note (Signed)
[facility]  Patient has been admitted to the hospital receiving medical treatment.  Admission date: 07/11/2018 Discharge date: 07/15/2018  Patient needs an additional week of rest and healing. Can go back to work on 07/24/2018.   Sincerely,  Carolan Shiver, MD

## 2018-07-28 ENCOUNTER — Ambulatory Visit
Admission: RE | Admit: 2018-07-28 | Discharge: 2018-07-28 | Disposition: A | Discharge status: Home / Self Care | Payer: PRIVATE HEALTH INSURANCE | Source: Ambulatory Visit | Attending: Surgery | Admitting: Surgery

## 2018-07-28 ENCOUNTER — Telehealth (INDEPENDENT_AMBULATORY_CARE_PROVIDER_SITE_OTHER): Payer: PRIVATE HEALTH INSURANCE | Admitting: Surgery

## 2018-07-28 ENCOUNTER — Other Ambulatory Visit
Admission: RE | Admit: 2018-07-28 | Discharge: 2018-07-28 | Disposition: A | Discharge status: Home / Self Care | Payer: PRIVATE HEALTH INSURANCE | Source: Ambulatory Visit | Attending: Surgery | Admitting: Surgery

## 2018-07-28 ENCOUNTER — Other Ambulatory Visit: Payer: Self-pay

## 2018-07-28 DIAGNOSIS — K5792 Diverticulitis of intestine, part unspecified, without perforation or abscess without bleeding: Secondary | ICD-10-CM

## 2018-07-28 HISTORY — DX: Essential (primary) hypertension: I10

## 2018-07-28 LAB — CBC WITH DIFFERENTIAL/PLATELET
Abs Immature Granulocytes: 0.05 10*3/uL (ref 0.00–0.07)
Basophils Absolute: 0.1 10*3/uL (ref 0.0–0.1)
Basophils Relative: 1 %
Eosinophils Absolute: 0.2 10*3/uL (ref 0.0–0.5)
Eosinophils Relative: 3 %
HCT: 43.4 % (ref 39.0–52.0)
Hemoglobin: 14.6 g/dL (ref 13.0–17.0)
Immature Granulocytes: 1 %
Lymphocytes Relative: 27 %
Lymphs Abs: 2.3 10*3/uL (ref 0.7–4.0)
MCH: 27.9 pg (ref 26.0–34.0)
MCHC: 33.6 g/dL (ref 30.0–36.0)
MCV: 83 fL (ref 80.0–100.0)
Monocytes Absolute: 0.6 10*3/uL (ref 0.1–1.0)
Monocytes Relative: 7 %
Neutro Abs: 5.2 10*3/uL (ref 1.7–7.7)
Neutrophils Relative %: 61 %
Platelets: 399 10*3/uL (ref 150–400)
RBC: 5.23 MIL/uL (ref 4.22–5.81)
RDW: 13.3 % (ref 11.5–15.5)
WBC: 8.4 10*3/uL (ref 4.0–10.5)
nRBC: 0 % (ref 0.0–0.2)

## 2018-07-28 LAB — COMPREHENSIVE METABOLIC PANEL
ALT: 35 U/L (ref 0–44)
AST: 20 U/L (ref 15–41)
Albumin: 4.2 g/dL (ref 3.5–5.0)
Alkaline Phosphatase: 74 U/L (ref 38–126)
Anion gap: 9 (ref 5–15)
BUN: 12 mg/dL (ref 6–20)
CO2: 26 mmol/L (ref 22–32)
Calcium: 9.2 mg/dL (ref 8.9–10.3)
Chloride: 98 mmol/L (ref 98–111)
Creatinine, Ser: 0.62 mg/dL (ref 0.61–1.24)
GFR calc Af Amer: >60 mL/min (ref >60)
GFR calc non Af Amer: >60 mL/min (ref >60)
Glucose, Bld: 344 mg/dL — ABNORMAL HIGH (ref 70–99)
Potassium: 4.1 mmol/L (ref 3.5–5.1)
Sodium: 133 mmol/L — ABNORMAL LOW (ref 135–145)
Total Bilirubin: 0.6 mg/dL (ref 0.3–1.2)
Total Protein: 7.6 g/dL (ref 6.5–8.1)

## 2018-07-28 LAB — POCT I-STAT CREATININE: Creatinine, Ser: 0.6 mg/dL — ABNORMAL LOW (ref 0.61–1.24)

## 2018-07-28 MED ORDER — IOHEXOL 300 MG/ML  SOLN
100.0000 mL | Freq: Once | INTRAMUSCULAR | Status: AC | PRN
  Administered 2018-07-28: 100 mL via INTRAVENOUS

## 2018-07-28 NOTE — Progress Notes (Signed)
Virtual Visit via Telephone Note  I connected with Dennis Gray on 07/28/18 at 10:45 AM EDT by telephone and verified that I am speaking with the correct person using two identifiers.  Location: Patient: Work Provider: Office   I discussed the limitations, risks, security and privacy concerns of performing an evaluation and management service by telephone and the availability of in person appointments. I also discussed with the patient that there may be a patient responsible charge related to this service. The patient expressed understanding and agreed to proceed.  This service was provided via telemedicine.  The patient consented to the visit being carried via telemedicine.  Patient's location: Work  Provider's location: Building services engineer: None  People participating in this telemedicine visit: Patient, myself  Time spent:  22 minutes   History of Present Illness: This is a 38 year old male with a history of suspected diverticulitis versus colon cancer with secondary bowel inflammation who was admitted to the hospital 07/11/2018.  He was treated conservatively as potential diverticulitis with antibiotics and was discharged to home on 5/16.  He completed antibiotics 3 days ago and has noted that his then, he has been having some increased left lower quadrant pain as well as nausea and had an episode of emesis yesterday he denies any fevers but reports feeling warm.  Denies any coughing or exposure to patients with COVID-19.   Observations/Objective: Patient appears comfortable in no acute distress.  He is able to carry a conversation with no respiratory issues.  Assessment and Plan: 38 year old male with suspected diverticulitis versus colon cancer with secondary bowel inflammation.  Discussed with the patient that given his progressing symptoms of left lower quadrant pain, possible fevers, nausea with emesis, will order a CT scan of abdomen and pelvis with contrast in  order to evaluate the area of diverticulitis of concern in the left lower quadrant as well as new set of labs in order to check his CBC, and CMP.  We will get all this done today and I will call the patient back with the results and whether he needs further antibiotics or not.  At this point it is too early to try to do any colonoscopies to check to see whether this is truly diverticulitis history versus colon cancer.  He understands that we will wait until mid July in order to be able to do a colonoscopy to further evaluate.  Follow Up Instructions:    I discussed the assessment and treatment plan with the patient. The patient was provided an opportunity to ask questions and all were answered. The patient agreed with the plan and demonstrated an understanding of the instructions.   The patient was advised to call back or seek an in-person evaluation if the symptoms worsen or if the condition fails to improve as anticipated.  I provided 22 minutes of non-face-to-face time during this encounter.   Henrene Dodge, MD

## 2018-08-04 ENCOUNTER — Ambulatory Visit: Payer: PRIVATE HEALTH INSURANCE

## 2018-09-13 ENCOUNTER — Telehealth: Payer: Self-pay | Admitting: *Deleted

## 2018-09-13 DIAGNOSIS — K5792 Diverticulitis of intestine, part unspecified, without perforation or abscess without bleeding: Secondary | ICD-10-CM

## 2018-09-13 NOTE — Telephone Encounter (Signed)
Patient called the office wanting to see if we were supposed to set up colonoscopy or if he was.   Note routed to Dr. Hampton Abbot.

## 2018-09-13 NOTE — Telephone Encounter (Signed)
Per Dr. Hampton Abbot, patient needs a referral to [facility] for a diagnostic colonoscopy.   Referral in Epic.   Patient aware to be expecting a call from their office. He verbalizes understanding.

## 2018-10-17 ENCOUNTER — Encounter (INDEPENDENT_AMBULATORY_CARE_PROVIDER_SITE_OTHER): Payer: Self-pay

## 2018-10-17 ENCOUNTER — Other Ambulatory Visit: Payer: Self-pay

## 2018-10-17 ENCOUNTER — Ambulatory Visit (INDEPENDENT_AMBULATORY_CARE_PROVIDER_SITE_OTHER): Payer: PRIVATE HEALTH INSURANCE | Admitting: Gastroenterology

## 2018-10-17 ENCOUNTER — Encounter: Payer: Self-pay | Admitting: Gastroenterology

## 2018-10-17 VITALS — BP 128/84 | HR 86 | Temp 97.9°F | Resp 17 | Wt 194.4 lb

## 2018-10-17 DIAGNOSIS — K5732 Diverticulitis of large intestine without perforation or abscess without bleeding: Secondary | ICD-10-CM | POA: Diagnosis not present

## 2018-10-17 DIAGNOSIS — Z8719 Personal history of other diseases of the digestive system: Secondary | ICD-10-CM

## 2018-10-17 NOTE — Progress Notes (Signed)
Arlyss Repressohini R Vanga, MD [address]  [address]  [place], [place] [address]  Main: [phone]  Fax: [phone]    Gastroenterology Consultation  Referring Provider:     Henrene DodgePiscoya, Jose, MD Primary Care Physician:  [facility] Primary Gastroenterologist:  Dr. Arlyss Repressohini R Vanga Reason for Consultation:   ?  Left-sided diverticulitis, to discuss about colonoscopy        HPI:   Dennis LeitzCarlos Rivera Gray is a 38 y.o. male referred by Dr. Aleen CampiPiscoya for consultation & management of recent history of possible sigmoid diverticulitis, to evaluate for colon cancer.  Patient was [facility] on 07/11/2018 secondary to severe left lower quadrant pain associated with nausea.  He had leukocytosis and CT abdomen and pelvis with contrast revealed possible acute sigmoid diverticulitis with phlegmon versus possible malignant mass.  He was managed with antibiotics, surgery was deferred.  He was seen by Dr. Aleen CampiPiscoya as outpatient on 07/28/2018.  He was still complaining of left lower quadrant pain associated nausea, underwent repeat CT scan which revealed interval improvement in the size of phlegmon as well as improved appearance of the sigmoid colon and adjacent mesocolon.  His leukocytosis also resolved.  He is referred for colonoscopy to rule out colon cancer  Interval summary Patient denies any left lower quadrant pain, nausea or vomiting.  He reports having bowel movement every time he eats.  Denies rectal bleeding, weight loss.  He denies family history of GI malignancy. He denies smoking or drinking alcohol.  He is a Corporate investment bankerconstruction worker  NSAIDs: None  Antiplts/Anticoagulants/Anti thrombotics: None  GI Procedures: None  Past Medical History:  Diagnosis Date  . Hypertension     No past surgical history on file.  Current Outpatient Medications:  .  insulin detemir (LEVEMIR) 100 UNIT/ML injection, Inject 25 Units into the skin daily., Disp: , Rfl:  .  metFORMIN (GLUMETZA)  1000 MG (MOD) 24 hr tablet, Take 1 tablet by mouth 2 (two) times a day., Disp: , Rfl:  .  TRULICITY 0.75 MG/0.5ML SOPN, , Disp: , Rfl:  .  fluconazole (DIFLUCAN) 150 MG tablet, , Disp: , Rfl:  .  gabapentin (NEURONTIN) 300 MG capsule, Take 1 capsule by mouth 3 (three) times daily., Disp: , Rfl:  .  glipiZIDE (GLUCOTROL) 10 MG tablet, Take by mouth., Disp: , Rfl:  .  glipiZIDE (GLUCOTROL) 5 MG tablet, Take 10 mg by mouth daily., Disp: , Rfl:  .  HYDROcodone-acetaminophen (NORCO/VICODIN) 5-325 MG tablet, , Disp: , Rfl:  .  insulin aspart (NOVOLOG) 100 UNIT/ML injection, Inject 25 Units into the skin at bedtime. , Disp: , Rfl:  .  methocarbamol (ROBAXIN) 500 MG tablet, TAKE 1 TABLET BY MOUTH TWICE A DAY, Disp: , Rfl:  .  predniSONE (DELTASONE) 5 MG tablet, , Disp: , Rfl:     No family history on file.   Social History   Tobacco Use  . Smoking status: Never Smoker  . Smokeless tobacco: Never Used  Substance Use Topics  . Alcohol use: Not Currently  . Drug use: Not Currently    Allergies as of 10/17/2018  . (No Known Allergies)    Review of Systems:    All systems reviewed and negative except where noted in HPI.   Physical Exam:  BP 128/84 (BP Location: Left Arm, Patient Position: Sitting, Cuff Size: Large)   Pulse 86   Temp 97.9 F (36.6 C)   Resp 17   Wt 194 lb 6.4 oz (88.2 kg)  BMI 31.38 kg/m  No LMP for male patient.  General:   Alert,  Well-developed, well-nourished, pleasant and cooperative in NAD Head:  Normocephalic and atraumatic. Eyes:  Sclera clear, no icterus.   Conjunctiva pink. Ears:  Normal auditory acuity. Nose:  No deformity, discharge, or lesions. Mouth:  No deformity or lesions,oropharynx pink & moist. Neck:  Supple; no masses or thyromegaly. Lungs:  Respirations even and unlabored.  Clear throughout to auscultation.   No wheezes, crackles, or rhonchi. No acute distress. Heart:  Regular rate and rhythm; no murmurs, clicks, rubs, or gallops. Abdomen:   Normal bowel sounds. Soft, non-tender and non-distended without masses, hepatosplenomegaly or hernias noted.  No guarding or rebound tenderness.   Rectal: Not performed Msk:  Symmetrical without gross deformities. Good, equal movement & strength bilaterally. Pulses:  Normal pulses noted. Extremities:  No clubbing or edema.  No cyanosis. Neurologic:  Alert and oriented x3;  grossly normal neurologically. Skin:  Intact without significant lesions or rashes. No jaundice. Psych:  Alert and cooperative. Normal mood and affect.  Imaging Studies: Reviewed  Assessment and Plan:   Dennis Gray is a 38 y.o. Hispanic male with probable sigmoid colon diverticulitis with phlegmon treated conservatively with antibiotics, repeat imaging with interval improvement of the inflammation as well as the size of phlegmon.  Patient is clinically asymptomatic for last 2 months  Recommend colonoscopy for further evaluation  I have discussed alternative options, risks & benefits,  which include, but are not limited to, bleeding, infection, perforation,respiratory complication & drug reaction.  The patient agrees with this plan & written consent will be obtained.     Follow up as needed   Cephas Darby, MD

## 2018-10-24 ENCOUNTER — Other Ambulatory Visit
Admission: RE | Admit: 2018-10-24 | Discharge: 2018-10-24 | Disposition: A | Discharge status: Home / Self Care | Payer: PRIVATE HEALTH INSURANCE | Source: Ambulatory Visit | Attending: Gastroenterology | Admitting: Gastroenterology

## 2018-10-24 ENCOUNTER — Other Ambulatory Visit: Payer: Self-pay

## 2018-10-24 DIAGNOSIS — Z01812 Encounter for preprocedural laboratory examination: Secondary | ICD-10-CM | POA: Diagnosis not present

## 2018-10-24 DIAGNOSIS — Z20828 Contact with and (suspected) exposure to other viral communicable diseases: Secondary | ICD-10-CM | POA: Diagnosis not present

## 2018-10-24 LAB — SARS CORONAVIRUS 2 (TAT 6-24 HRS): SARS Coronavirus 2: NEGATIVE

## 2018-10-27 ENCOUNTER — Encounter: Payer: Self-pay | Admitting: Anesthesiology

## 2018-10-27 ENCOUNTER — Ambulatory Visit: Payer: PRIVATE HEALTH INSURANCE | Admitting: Anesthesiology

## 2018-10-27 ENCOUNTER — Ambulatory Visit
Admission: RE | Admit: 2018-10-27 | Discharge: 2018-10-27 | Disposition: A | Discharge status: Home / Self Care | Payer: PRIVATE HEALTH INSURANCE | Attending: Gastroenterology | Admitting: Gastroenterology

## 2018-10-27 ENCOUNTER — Encounter
Admission: RE | Disposition: A | Discharge status: Home / Self Care | Payer: Self-pay | Source: Home / Self Care | Attending: Gastroenterology

## 2018-10-27 DIAGNOSIS — Z79899 Other long term (current) drug therapy: Secondary | ICD-10-CM | POA: Insufficient documentation

## 2018-10-27 DIAGNOSIS — K5732 Diverticulitis of large intestine without perforation or abscess without bleeding: Secondary | ICD-10-CM | POA: Diagnosis present

## 2018-10-27 DIAGNOSIS — Z794 Long term (current) use of insulin: Secondary | ICD-10-CM | POA: Insufficient documentation

## 2018-10-27 DIAGNOSIS — E119 Type 2 diabetes mellitus without complications: Secondary | ICD-10-CM | POA: Insufficient documentation

## 2018-10-27 DIAGNOSIS — Z8719 Personal history of other diseases of the digestive system: Secondary | ICD-10-CM

## 2018-10-27 DIAGNOSIS — I10 Essential (primary) hypertension: Secondary | ICD-10-CM | POA: Insufficient documentation

## 2018-10-27 DIAGNOSIS — K5730 Diverticulosis of large intestine without perforation or abscess without bleeding: Secondary | ICD-10-CM | POA: Diagnosis not present

## 2018-10-27 HISTORY — PX: COLONOSCOPY WITH PROPOFOL: SHX5780

## 2018-10-27 LAB — GLUCOSE, CAPILLARY: Glucose-Capillary: 205 mg/dL — ABNORMAL HIGH (ref 70–99)

## 2018-10-27 SURGERY — COLONOSCOPY WITH PROPOFOL
Anesthesia: General

## 2018-10-27 MED ORDER — PROPOFOL 500 MG/50ML IV EMUL
INTRAVENOUS | Status: DC | PRN
  Administered 2018-10-27: 175 ug/kg/min via INTRAVENOUS

## 2018-10-27 MED ORDER — LIDOCAINE HCL (PF) 1 % IJ SOLN
INTRAMUSCULAR | Status: AC
  Administered 2018-10-27: 0.3 mL
  Filled 2018-10-27: qty 2

## 2018-10-27 MED ORDER — PROPOFOL 10 MG/ML IV BOLUS
INTRAVENOUS | Status: DC | PRN
  Administered 2018-10-27: 60 mg via INTRAVENOUS

## 2018-10-27 MED ORDER — SODIUM CHLORIDE 0.9 % IV SOLN
INTRAVENOUS | Status: DC
  Administered 2018-10-27: 09:00:00 1000 mL via INTRAVENOUS

## 2018-10-27 NOTE — Anesthesia Post-op Follow-up Note (Signed)
Anesthesia QCDR form completed.        

## 2018-10-27 NOTE — Transfer of Care (Signed)
Immediate Anesthesia Transfer of Care Note  Patient: Dennis Gray  Procedure(s) Performed: COLONOSCOPY WITH PROPOFOL (N/A )  Patient Location: PACU  Anesthesia Type:General  Level of Consciousness: awake, alert  and oriented  Airway & Oxygen Therapy: Patient Spontanous Breathing and Patient connected to nasal cannula oxygen  Post-op Assessment: Report given to RN and Post -op Vital signs reviewed and stable  Post vital signs: Reviewed and stable  Last Vitals:  Vitals Value Taken Time  BP    Temp    Pulse    Resp    SpO2      Last Pain:  Vitals:   10/27/18 0844  PainSc: 0-No pain         Complications: No apparent anesthesia complications

## 2018-10-27 NOTE — H&P (Signed)
Dennis Repressohini R Lujain Kraszewski, MD [address]  [address]  [place], [place] [address]  Main: [phone]  Fax: [phone] Pager: [phone]  Primary Care Physician:  [facility] Primary Gastroenterologist:  Dr. Arlyss Repressohini R Tylin Force  Pre-Procedure History & Physical: HPI:  Dennis Gray is a 38 y.o. male is here for an colonoscopy.   Past Medical History:  Diagnosis Date  . Hypertension     No past surgical history on file.  Prior to Admission medications   Medication Sig Start Date End Date Taking? Authorizing Provider  fluconazole (DIFLUCAN) 150 MG tablet  06/21/18   [provider]  gabapentin (NEURONTIN) 300 MG capsule Take 1 capsule by mouth 3 (three) times daily. 06/01/18   [provider]  glipiZIDE (GLUCOTROL) 10 MG tablet Take by mouth.    [provider]  glipiZIDE (GLUCOTROL) 5 MG tablet Take 10 mg by mouth daily. 01/22/09   [provider]  HYDROcodone-acetaminophen (NORCO/VICODIN) 5-325 MG tablet  07/18/18   [provider]  insulin aspart (NOVOLOG) 100 UNIT/ML injection Inject 25 Units into the skin at bedtime.     [provider]  insulin detemir (LEVEMIR) 100 UNIT/ML injection Inject 25 Units into the skin daily.    [provider]  metFORMIN (GLUMETZA) 1000 MG (MOD) 24 hr tablet Take 1 tablet by mouth 2 (two) times a day. 05/11/18   [provider]  methocarbamol (ROBAXIN) 500 MG tablet TAKE 1 TABLET BY MOUTH TWICE A DAY 03/14/17   [provider]  predniSONE (DELTASONE) 5 MG tablet  06/23/18   [provider]  TRULICITY 0.75 MG/0.5ML SOPN  09/20/18   [provider]    Allergies as of 10/17/2018  . (No Known Allergies)    No family history on file.  Social History   Socioeconomic History  . Marital status: Married    Spouse name: Not on file  . Number of children: Not on file  . Years of education: Not on file  . Highest education level: Not  on file  Occupational History  . Not on file  Social Needs  . Financial resource strain: Not on file  . Food insecurity    Worry: Not on file    Inability: Not on file  . Transportation needs    Medical: Not on file    Non-medical: Not on file  Tobacco Use  . Smoking status: Never Smoker  . Smokeless tobacco: Never Used  Substance and Sexual Activity  . Alcohol use: Not Currently  . Drug use: Not Currently  . Sexual activity: Not on file  Lifestyle  . Physical activity    Days per week: Not on file    Minutes per session: Not on file  . Stress: Not on file  Relationships  . Social Musicianconnections    Talks on phone: Not on file    Gets together: Not on file    Attends religious service: Not on file    Active member of club or organization: Not on file    Attends meetings of clubs or organizations: Not on file    Relationship status: Not on file  . Intimate partner violence    Fear of current or ex partner: Not on file    Emotionally abused: Not on file    Physically abused: Not on file    Forced sexual activity: Not on file  Other Topics Concern  . Not on file  Social History Narrative  . Not  on file    Review of Systems: See HPI, otherwise negative ROS  Physical Exam: BP 121/88   Pulse 90   Temp 97.6 F (36.4 C)   Resp 17   Ht 5\' 6"  (1.676 m)   Wt 88.2 kg   SpO2 99%   BMI 31.38 kg/m  General:   Alert,  pleasant and cooperative in NAD Head:  Normocephalic and atraumatic. Neck:  Supple; no masses or thyromegaly. Lungs:  Clear throughout to auscultation.    Heart:  Regular rate and rhythm. Abdomen:  Soft, nontender and nondistended. Normal bowel sounds, without guarding, and without rebound.   Neurologic:  Alert and  oriented x4;  grossly normal neurologically.  Impression/Plan: Dennis Gray is here for an colonoscopy to be performed for h/o left sided diverticulitis  Risks, benefits, limitations, and alternatives regarding  colonoscopy have been  reviewed with the patient.  Questions have been answered.  All parties agreeable.   Sherri Sear, MD  10/27/2018, 8:57 AM

## 2018-10-27 NOTE — Op Note (Signed)
[facility] Patient Name: Dennis Gray [facility] Procedure Date: 10/27/2018 8:52 AM MRN: [id number] Account #: [id number] Date of Birth: 1980/03/23 Admit Type: Outpatient Age: 38 Room: [facility] ENDO ROOM 1 Gender: Male Note Status: Finalized Procedure:            Colonoscopy Indications:          Diverticulitis, , rule out malignancy Providers:            Lin Landsman MD, MD Medicines:            Monitored Anesthesia Care Complications:        No immediate complications. Estimated blood loss: None. Procedure:            Pre-Anesthesia Assessment:                       - Prior to the procedure, a History and Physical was                        performed, and patient medications and allergies were                        reviewed. The patient is competent. The risks and                        benefits of the procedure and the sedation options and                        risks were discussed with the patient. All questions                        were answered and informed consent was obtained.                        Patient identification and proposed procedure were                        verified by the physician, the nurse, the                        anesthesiologist, the anesthetist and the technician in                        the pre-procedure area in the procedure room in the                        endoscopy suite. Mental Status Examination: alert and                        oriented. Airway Examination: normal oropharyngeal                        airway and neck mobility. Respiratory Examination:                        clear to auscultation. CV Examination: normal.                        Prophylactic Antibiotics: The patient does not require  prophylactic antibiotics. Prior Anticoagulants: The                        patient has taken no previous anticoagulant or                        antiplatelet agents. ASA Grade Assessment:  II - A                        patient with mild systemic disease. After reviewing the                        risks and benefits, the patient was deemed in                        satisfactory condition to undergo the procedure. The                        anesthesia plan was to use monitored anesthesia care                        (MAC). Immediately prior to administration of                        medications, the patient was re-assessed for adequacy                        to receive sedatives. The heart rate, respiratory rate,                        oxygen saturations, blood pressure, adequacy of                        pulmonary ventilation, and response to care were                        monitored throughout the procedure. The physical status                        of the patient was re-assessed after the procedure.                       After obtaining informed consent, the colonoscope was                        passed under direct vision. Throughout the procedure,                        the patient's blood pressure, pulse, and oxygen                        saturations were monitored continuously. The                        Colonoscope was introduced through the anus and                        advanced to the the terminal ileum, with identification  of the appendiceal orifice and IC valve. The                        colonoscopy was performed without difficulty. The                        patient tolerated the procedure well. The quality of                        the bowel preparation was evaluated using the BBPS                        [facility] Bowel Preparation Scale) with scores of: Right                        Colon = 3, Transverse Colon = 3 and Left Colon = 3                        (entire mucosa seen well with no residual staining,                        small fragments of stool or opaque liquid). The total                        BBPS score equals 9. Findings:       The perianal and digital rectal examinations were normal. Pertinent       negatives include normal sphincter tone and no palpable rectal lesions.      The terminal ileum appeared normal.      Multiple diverticula were found in the sigmoid colon. There was no       evidence of diverticular bleeding.      The retroflexed view of the distal rectum and anal verge was normal and       showed no anal or rectal abnormalities.      The exam was otherwise without abnormality. Impression:           - The examined portion of the ileum was normal.                       - Severe diverticulosis in the sigmoid colon. There was                        no evidence of diverticular bleeding.                       - The distal rectum and anal verge are normal on                        retroflexion view.                       - The examination was otherwise normal.                       - No specimens collected. Recommendation:       - Discharge patient to home (with escort).                       - Resume previous diet today.                       -  Continue present medications.                       - Repeat colonoscopy at age 11 for screening purposes. Procedure Code(s):    --- Professional ---                       [id number], Colonoscopy, flexible; diagnostic, including                        collection of specimen(s) by brushing or washing, when                        performed (separate procedure) Diagnosis Code(s):    --- Professional ---                       L07.61, Diverticulitis of large intestine without                        perforation or abscess without bleeding                       K57.30, Diverticulosis of large intestine without                        perforation or abscess without bleeding CPT copyright 2019 American Medical Association. All rights reserved. The codes documented in this report are preliminary and upon coder review may  be revised to meet current compliance  requirements. Dr. Ulyess Mort Lin Landsman MD, MD 10/27/2018 9:24:19 AM This report has been signed electronically. Number of Addenda: 0 Note Initiated On: 10/27/2018 8:52 AM Scope Withdrawal Time: 0 hours 6 minutes 29 seconds  Total Procedure Duration: 0 hours 8 minutes 24 seconds  Estimated Blood Loss: Estimated blood loss: none.      [facility]

## 2018-10-27 NOTE — Anesthesia Preprocedure Evaluation (Signed)
Anesthesia Evaluation  Patient identified by MRN, date of birth, ID band Patient awake    Reviewed: Allergy & Precautions, H&P , NPO status , Patient's Chart, lab work & pertinent test results, reviewed documented beta blocker date and time   History of Anesthesia Complications Negative for: history of anesthetic complications  Airway Mallampati: II  TM Distance: >3 FB Neck ROM: full    Dental  (+) Dental Advidsory Given, Teeth Intact   Pulmonary neg pulmonary ROS,    Pulmonary exam normal        Cardiovascular Exercise Tolerance: Good hypertension, (-) angina(-) Past MI and (-) Cardiac Stents Normal cardiovascular exam(-) dysrhythmias (-) Valvular Problems/Murmurs     Neuro/Psych negative neurological ROS  negative psych ROS   GI/Hepatic negative GI ROS, Neg liver ROS,   Endo/Other  diabetes  Renal/GU negative Renal ROS  negative genitourinary   Musculoskeletal   Abdominal   Peds  Hematology negative hematology ROS (+)   Anesthesia Other Findings Past Medical History: No date: Hypertension   Reproductive/Obstetrics negative OB ROS                             Anesthesia Physical Anesthesia Plan  ASA: II  Anesthesia Plan: General   Post-op Pain Management:    Induction: Intravenous  PONV Risk Score and Plan: 2 and Propofol infusion and TIVA  Airway Management Planned: Natural Airway and Nasal Cannula  Additional Equipment:   Intra-op Plan:   Post-operative Plan:   Informed Consent: I have reviewed the patients History and Physical, chart, labs and discussed the procedure including the risks, benefits and alternatives for the proposed anesthesia with the patient or authorized representative who has indicated his/her understanding and acceptance.     Dental Advisory Given  Plan Discussed with: Anesthesiologist, CRNA and Surgeon  Anesthesia Plan Comments:          Anesthesia Quick Evaluation

## 2018-10-27 NOTE — Anesthesia Postprocedure Evaluation (Signed)
Anesthesia Post Note  Patient: Dennis Gray  Procedure(s) Performed: COLONOSCOPY WITH PROPOFOL (N/A )  Patient location during evaluation: Endoscopy Anesthesia Type: General Level of consciousness: awake and alert Pain management: pain level controlled Vital Signs Assessment: post-procedure vital signs reviewed and stable Respiratory status: spontaneous breathing, nonlabored ventilation, respiratory function stable and patient connected to nasal cannula oxygen Cardiovascular status: blood pressure returned to baseline and stable Postop Assessment: no apparent nausea or vomiting Anesthetic complications: no     Last Vitals:  Vitals:   10/27/18 0937 10/27/18 0947  BP: 106/74 108/79  Pulse: 83 78  Resp: 20 10  Temp:    SpO2: 97% 99%    Last Pain:  Vitals:   10/27/18 0947  TempSrc:   PainSc: 0-No pain                 Martha Clan

## 2018-10-27 NOTE — Anesthesia Procedure Notes (Signed)
Date/Time: 10/27/2018 9:18 AM Performed by: Nelda Marseille, CRNA Pre-anesthesia Checklist: Patient identified, Emergency Drugs available, Suction available, Patient being monitored and Timeout performed Oxygen Delivery Method: Nasal cannula

## 2019-01-29 ENCOUNTER — Other Ambulatory Visit: Payer: Self-pay | Admitting: Physician Assistant

## 2019-01-29 DIAGNOSIS — G4452 New daily persistent headache (NDPH): Secondary | ICD-10-CM

## 2019-02-09 ENCOUNTER — Ambulatory Visit
Admission: RE | Admit: 2019-02-09 | Discharge: 2019-02-09 | Disposition: A | Discharge status: Home / Self Care | Payer: PRIVATE HEALTH INSURANCE | Source: Ambulatory Visit | Attending: Physician Assistant | Admitting: Physician Assistant

## 2019-02-09 ENCOUNTER — Other Ambulatory Visit: Payer: Self-pay

## 2019-02-09 DIAGNOSIS — G4452 New daily persistent headache (NDPH): Secondary | ICD-10-CM | POA: Insufficient documentation

## 2019-11-26 ENCOUNTER — Other Ambulatory Visit: Payer: PRIVATE HEALTH INSURANCE

## 2020-10-06 IMAGING — MR MR HEAD W/O CM
12 series · 43 of 48 positions shown · non-contrast
Comparison: None.

CLINICAL DATA: 38-year-old male with persistent headaches for 2
months.

EXAM:
MRI HEAD WITHOUT CONTRAST
TECHNIQUE: Multiplanar, multiecho pulse sequences of the brain and surrounding
structures were obtained without intravenous contrast.

[Series 5: ax dwi_tracew · axial · 3.0mm · 0.60mm/px · z∈[-124,+31]mm · 4 of 48 slices shown]
[im 1/48]
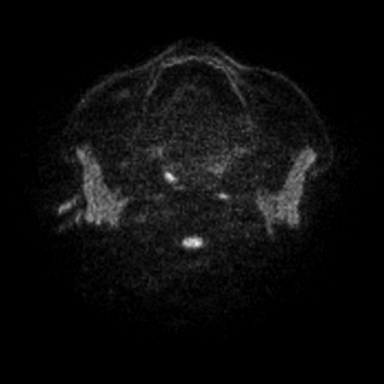
[im 16/48]
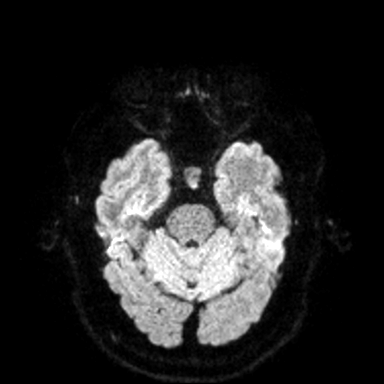
[im 32/48]
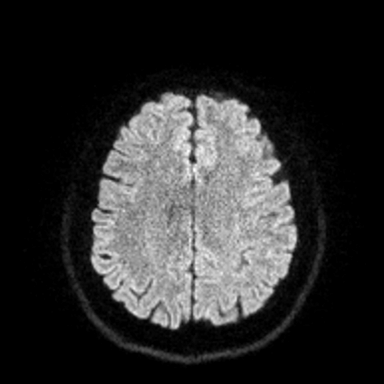
[im 48/48]
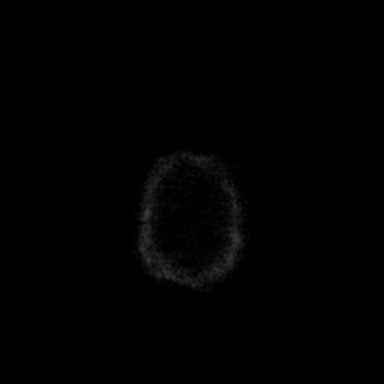

[Series 6: ax dwi_adc · axial · 3.0mm · 0.60mm/px · z∈[-124,+31]mm · 4 of 48 slices shown]
[im 1/48]
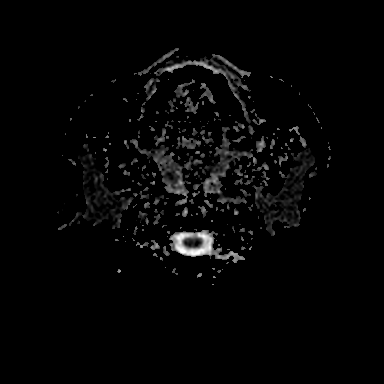
[im 16/48]
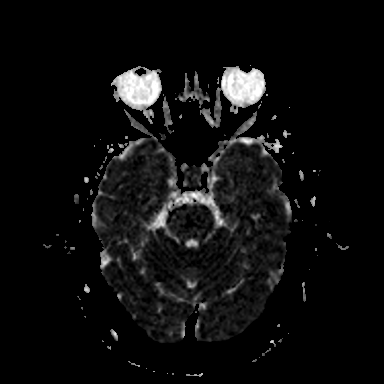
[im 32/48]
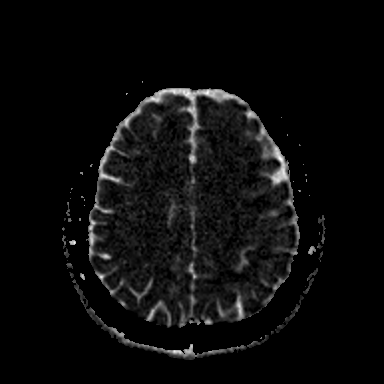
[im 48/48]
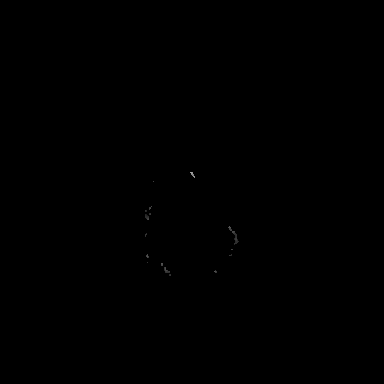

[Series 7: cor dwi_tracew · coronal · 5.0mm · 0.60mm/px · 3 of 38 slices shown]
[im 1/38]
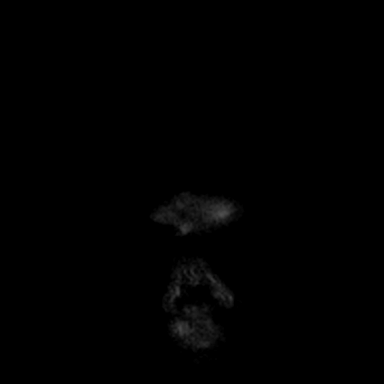
[im 19/38]
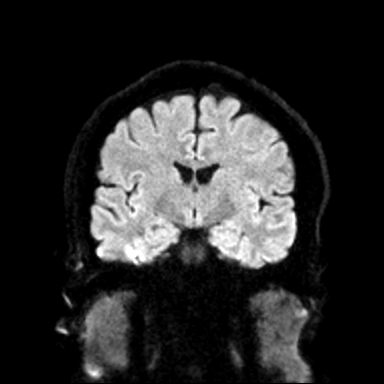
[im 38/38]
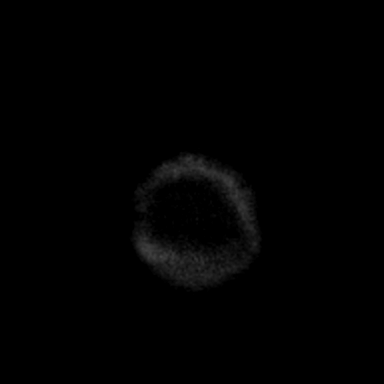

[Series 8: cor dwi_adc · coronal · 5.0mm · 0.60mm/px · 3 of 38 slices shown]
[im 1/38]
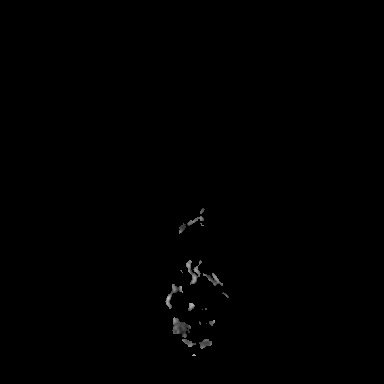
[im 19/38]
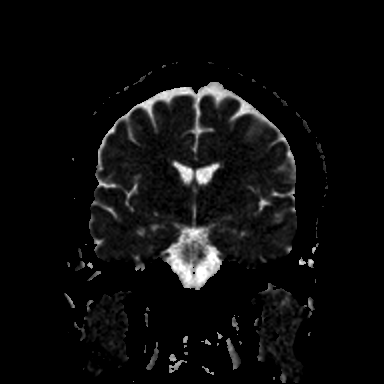
[im 38/38]
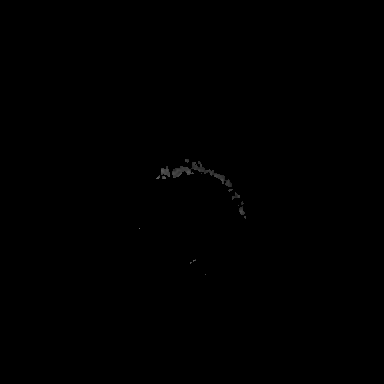

[Series 9: T1 · sagittal · 5.0mm · 0.62mm/px · 2 of 21 slices shown (1 of 2)]
[im 1/21]
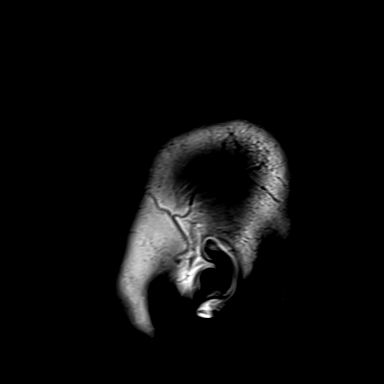
[im 21/21]
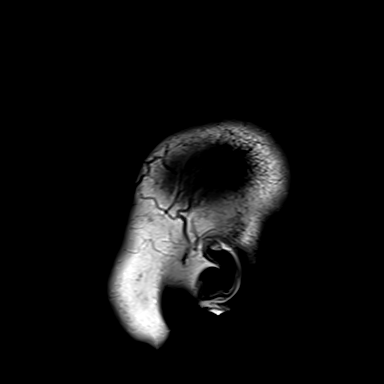

[Series 10: T2 · axial · 5.0mm · 0.53mm/px · z∈[-118,+26]mm · 2 of 25 slices shown (1 of 2)]
[im 1/25]
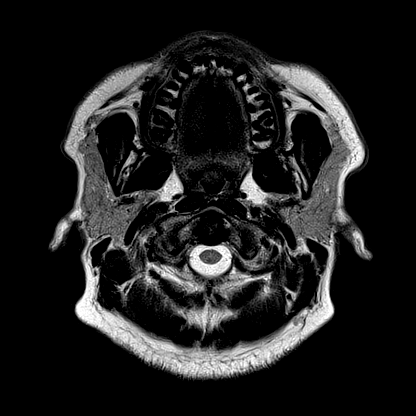
[im 25/25]
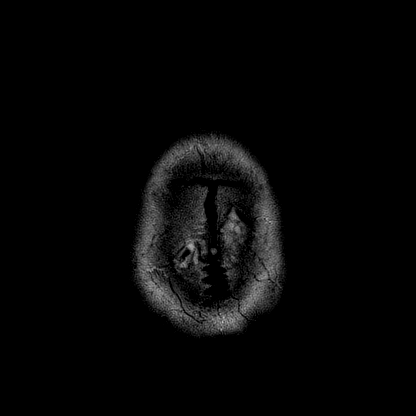

[Series 11: mag_images · axial · 3.0mm · 0.90mm/px · z∈[-134,+42]mm · 4 of 60 slices shown]
[im 1/60]
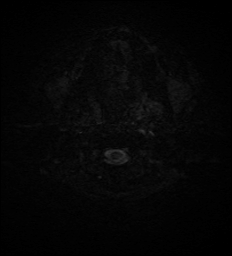
[im 20/60]
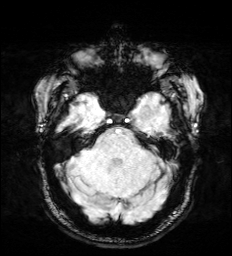
[im 40/60]
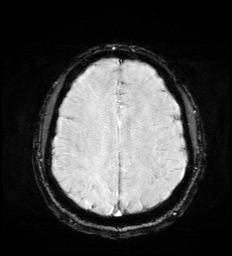
[im 60/60]
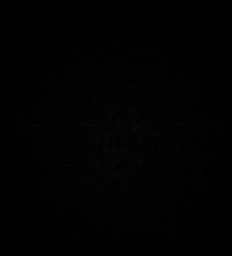

[Series 12: pha_images · axial · 3.0mm · 0.90mm/px · z∈[-134,+39]mm · 4 of 59 slices shown]
[im 1/59]
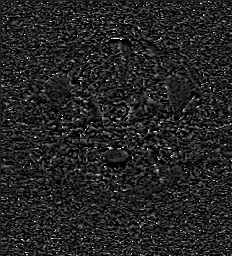
[im 20/59]
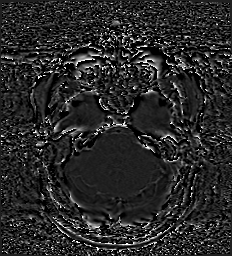
[im 39/59]
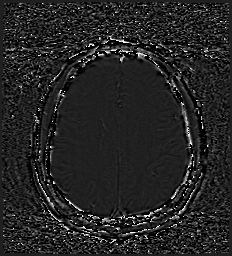
[im 59/59]
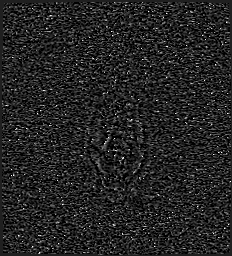

[Series 13: swi_images · axial · 3.0mm · 0.90mm/px · z∈[-134,-18]mm · 3 of 60 slices shown]
[im 1/60]
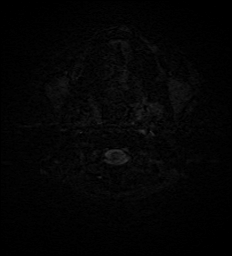
[im 20/60]
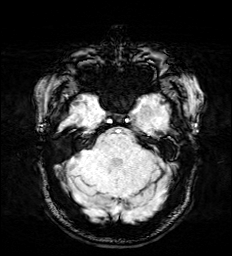
[im 40/60]
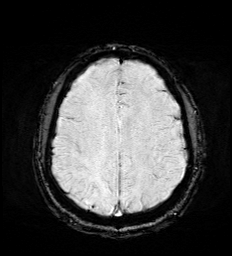

[Series 15: FLAIR · axial · 3.0mm · 0.53mm/px · z∈[-127,+35]mm · 4 of 55 slices shown]
[im 1/55]
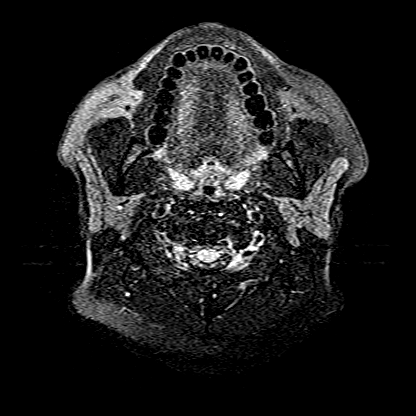
[im 19/55]
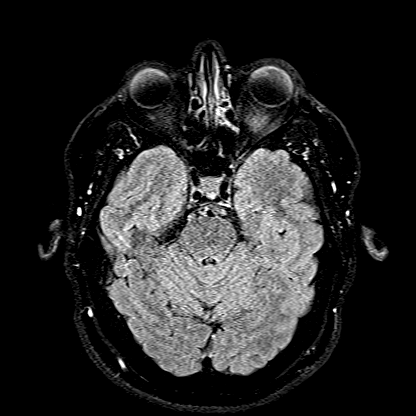
[im 37/55]
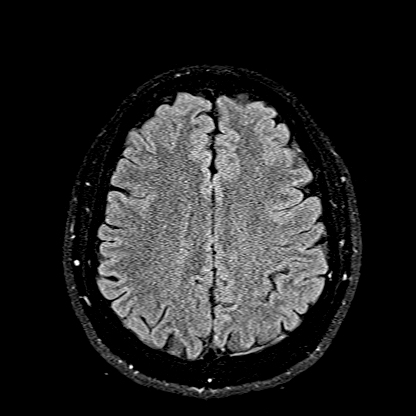
[im 55/55]
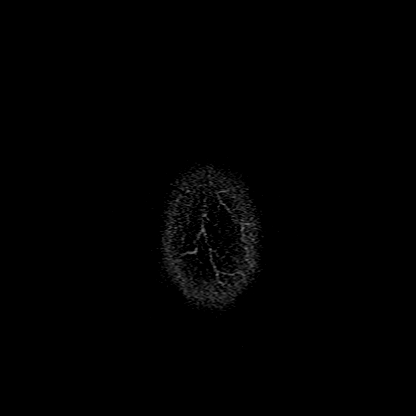

[Series 16: T1 · axial · 1.0mm · 0.98mm/px · z∈[-126,+33]mm · 8 of 160 slices shown (2 of 2)]
[im 1/160]
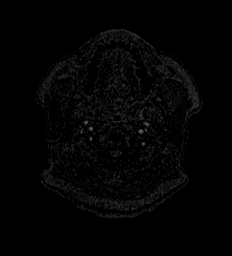
[im 29/160]
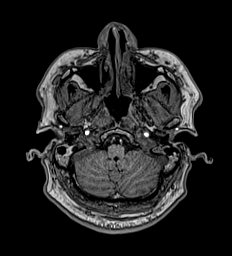
[im 44/160]
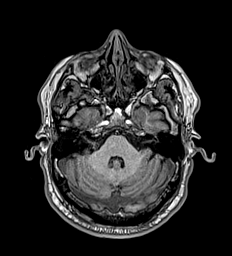
[im 73/160]
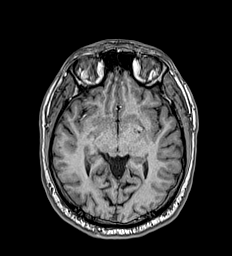
[im 87/160]
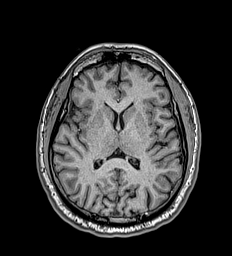
[im 116/160]
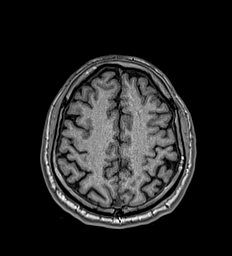
[im 131/160]
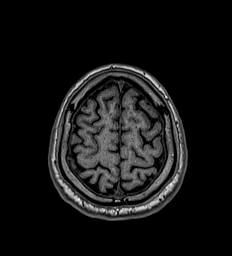
[im 160/160]
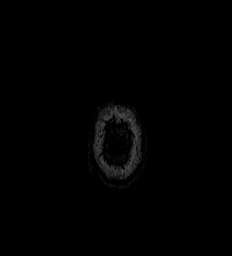

[Series 17: T2 · coronal · 5.0mm · 0.57mm/px · 2 of 29 slices shown (2 of 2)]
[im 1/29]
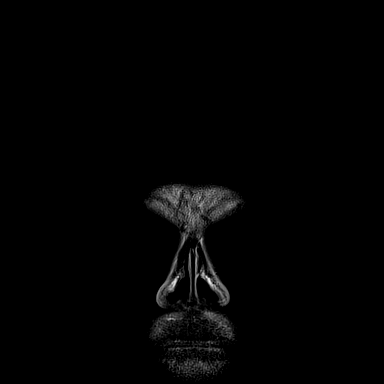
[im 29/29]
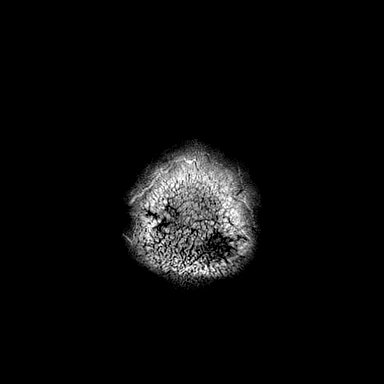

[43 of 48 positions shown; findings below may reference images not displayed]

FINDINGS: Brain: Normal cerebral volume. Cavum vergae, normal variant. No
restricted diffusion to suggest acute infarction. No midline shift,
mass effect, evidence of mass lesion, ventriculomegaly, extra-axial
collection or acute intracranial hemorrhage. Cervicomedullary
junction and pituitary are within normal limits.

Gray and white matter signal is within normal limits throughout the
brain. No encephalomalacia or chronic cerebral blood products
identified.

Vascular: Major intracranial vascular flow voids are preserved.

Skull and upper cervical spine: Normal visible cervical spine.
Normal bone marrow signal.

Sinuses/Orbits: Negative orbits. Paranasal sinuses and mastoids are
well pneumatized.

Other: Visible internal auditory structures appear normal. Normal
stylomastoid foramina. Scalp and face soft tissues appear negative.
IMPRESSION: Normal noncontrast MRI appearance of the brain. No explanation for
headaches.

## 2021-01-21 ENCOUNTER — Other Ambulatory Visit: Payer: Self-pay | Admitting: Podiatry

## 2021-01-27 ENCOUNTER — Other Ambulatory Visit: Payer: Self-pay | Admitting: Podiatry

## 2021-01-29 NOTE — Discharge Instructions (Addendum)
[facility] INSTRUCCIONES POSOPERATORIAS DEL DR. Bradd Canary EL DR. Dreshon Proffit [facility] POST OPERATIVE INSTRUCTIONS FOR DR. Ether Griffins AND DR. Setsuko Robins [facility] CLINIC PODIATRY DEPARTMENT (Spanish)   Tome su medicamento segn se lo recetaron. Los medicamentos para el dolor solamente deben tomarse si son  necesarios.   Mantenga el vendaje limpio, seco e intacto.   Mantenga el pie elevado por encima del nivel del corazn durante las primeras 48 horas.   Puede caminar al bao por perodos breves, a menos que le hayamos indicado que no ponga peso en ese pie.   Siempre use su zapato posoperatorio al caminar. Siempre use sus muletas si no debe poner peso en ese pie.   No se duche. Se puede baar en baera (tina) siempre que mantenga su pie fuera del agua.   Cada hora que est despierto(a):   Doble la rodilla 15 veces  Flexione el pie 15 veces  [place] la pantorrilla 15 veces   Llame a [facility] [phone]) si presenta alguno de los siguientes problemas:  Le da fiebre o temperatura.  La venda se satura de [place].  Los medicamentos no Occupational psychologist.  Se lesiona el pie.  Si presenta algn sntoma de infeccin, como enrojecimiento, olor o rayas rojas que salen de la herida  [employer] [facility]  POST OPERATIVE INSTRUCTIONS FOR DR. Ether Griffins AND DR. Chrisoula Zegarra [facility] CLINIC PODIATRY DEPARTMENT   Take your medication as prescribed.  Pain medication should be taken only as needed.  Keep the dressing clean, dry and intact.  Keep your foot elevated above the heart level for the first 48 hours.  Continue elevation thereafter to improve swelling.  Walking to the bathroom and brief periods of walking are acceptable, unless we have instructed you to be non-weight bearing.  Only try to walk for short distances and do not be on your feet for prolonged periods of time for the next 2  weeks.  Always wear your post-op shoe when walking.  Always use your crutches if you are to be non-weight bearing.  Do not take a shower. Baths are permissible as long as the foot is kept out of the water.   Every hour you are awake:  Bend your knee 15 times. Massage calf 15 times  Call [facility] [phone]) if any of the following problems occur: You develop a temperature or fever. The bandage becomes saturated with blood. Medication does not stop your pain. Injury of the foot occurs. Any symptoms of infection including redness, odor, or red streaks running from wound.

## 2021-01-30 ENCOUNTER — Encounter: Payer: Self-pay | Admitting: Podiatry

## 2021-02-05 ENCOUNTER — Other Ambulatory Visit: Payer: Self-pay

## 2021-02-05 ENCOUNTER — Encounter: Payer: Self-pay | Admitting: Podiatry

## 2021-02-05 ENCOUNTER — Ambulatory Visit: Payer: PRIVATE HEALTH INSURANCE | Admitting: Anesthesiology

## 2021-02-05 ENCOUNTER — Encounter
Admission: RE | Disposition: A | Discharge status: Home / Self Care | Payer: Self-pay | Source: Home / Self Care | Attending: Podiatry

## 2021-02-05 ENCOUNTER — Ambulatory Visit
Admission: RE | Admit: 2021-02-05 | Discharge: 2021-02-05 | Disposition: A | Discharge status: Home / Self Care | Payer: PRIVATE HEALTH INSURANCE | Attending: Podiatry | Admitting: Podiatry

## 2021-02-05 DIAGNOSIS — M79672 Pain in left foot: Secondary | ICD-10-CM | POA: Insufficient documentation

## 2021-02-05 DIAGNOSIS — E119 Type 2 diabetes mellitus without complications: Secondary | ICD-10-CM | POA: Insufficient documentation

## 2021-02-05 DIAGNOSIS — G8929 Other chronic pain: Secondary | ICD-10-CM | POA: Insufficient documentation

## 2021-02-05 DIAGNOSIS — M792 Neuralgia and neuritis, unspecified: Secondary | ICD-10-CM | POA: Diagnosis not present

## 2021-02-05 DIAGNOSIS — M67472 Ganglion, left ankle and foot: Secondary | ICD-10-CM | POA: Diagnosis not present

## 2021-02-05 DIAGNOSIS — M545 Low back pain, unspecified: Secondary | ICD-10-CM | POA: Insufficient documentation

## 2021-02-05 HISTORY — DX: Type 2 diabetes mellitus without complications: E11.9

## 2021-02-05 HISTORY — PX: GANGLION CYST EXCISION: SHX1691

## 2021-02-05 LAB — GLUCOSE, CAPILLARY
Glucose-Capillary: 139 mg/dL — ABNORMAL HIGH (ref 70–99)
Glucose-Capillary: 118 mg/dL — ABNORMAL HIGH (ref 70–99)

## 2021-02-05 SURGERY — EXCISION, GANGLION CYST, FOOT
Anesthesia: General | Site: Foot | Laterality: Left

## 2021-02-05 MED ORDER — LIDOCAINE HCL (CARDIAC) PF 100 MG/5ML IV SOSY
PREFILLED_SYRINGE | INTRAVENOUS | Status: DC | PRN
  Administered 2021-02-05: 60 mg via INTRATRACHEAL

## 2021-02-05 MED ORDER — OXYCODONE HCL 5 MG PO TABS: 5.0000 mg | ORAL_TABLET | Freq: Once | ORAL | Status: DC | PRN

## 2021-02-05 MED ORDER — MIDAZOLAM HCL 5 MG/5ML IJ SOLN
INTRAMUSCULAR | Status: DC | PRN
  Administered 2021-02-05: 2 mg via INTRAVENOUS

## 2021-02-05 MED ORDER — FENTANYL CITRATE PF 50 MCG/ML IJ SOSY: 25.0000 ug | PREFILLED_SYRINGE | INTRAMUSCULAR | Status: DC | PRN

## 2021-02-05 MED ORDER — OXYCODONE HCL 5 MG/5ML PO SOLN: 5.0000 mg | Freq: Once | ORAL | Status: DC | PRN

## 2021-02-05 MED ORDER — HYDROCODONE-ACETAMINOPHEN 5-325 MG PO TABS: 1.0000 | ORAL_TABLET | Freq: Four times a day (QID) | ORAL | 0 refills | Status: AC | PRN

## 2021-02-05 MED ORDER — DEXAMETHASONE SODIUM PHOSPHATE 4 MG/ML IJ SOLN
INTRAMUSCULAR | Status: DC | PRN
  Administered 2021-02-05: 4 mg via INTRAVENOUS

## 2021-02-05 MED ORDER — BUPIVACAINE HCL 0.5 % IJ SOLN
INTRAMUSCULAR | Status: DC | PRN
  Administered 2021-02-05: 10 mL

## 2021-02-05 MED ORDER — LIDOCAINE HCL 1 % IJ SOLN
INTRAMUSCULAR | Status: DC | PRN
  Administered 2021-02-05: 10 mL

## 2021-02-05 MED ORDER — ONDANSETRON HCL 4 MG/2ML IJ SOLN
INTRAMUSCULAR | Status: DC | PRN
  Administered 2021-02-05: 4 mg via INTRAVENOUS

## 2021-02-05 MED ORDER — ACETAMINOPHEN 160 MG/5ML PO SOLN: 325.0000 mg | ORAL | Status: DC | PRN

## 2021-02-05 MED ORDER — CEFAZOLIN SODIUM-DEXTROSE 2-4 GM/100ML-% IV SOLN
2.0000 g | INTRAVENOUS | Status: AC
  Administered 2021-02-05: 2 g via INTRAVENOUS

## 2021-02-05 MED ORDER — ONDANSETRON HCL 4 MG/2ML IJ SOLN: 4.0000 mg | Freq: Once | INTRAMUSCULAR | Status: DC | PRN

## 2021-02-05 MED ORDER — LACTATED RINGERS IV SOLN: INTRAVENOUS | Status: DC

## 2021-02-05 MED ORDER — FENTANYL CITRATE (PF) 100 MCG/2ML IJ SOLN
INTRAMUSCULAR | Status: DC | PRN
  Administered 2021-02-05: 50 ug via INTRAVENOUS

## 2021-02-05 MED ORDER — ACETAMINOPHEN 325 MG PO TABS: 325.0000 mg | ORAL_TABLET | ORAL | Status: DC | PRN

## 2021-02-05 MED ORDER — 0.9 % SODIUM CHLORIDE (POUR BTL) OPTIME
TOPICAL | Status: DC | PRN
  Administered 2021-02-05: 500 mL

## 2021-02-05 MED ORDER — GLYCOPYRROLATE 0.2 MG/ML IJ SOLN
INTRAMUSCULAR | Status: DC | PRN
  Administered 2021-02-05: .1 mg via INTRAVENOUS

## 2021-02-05 MED ORDER — PROPOFOL 10 MG/ML IV BOLUS
INTRAVENOUS | Status: DC | PRN
  Administered 2021-02-05: 160 mg via INTRAVENOUS

## 2021-02-05 MED ORDER — AMOXICILLIN-POT CLAVULANATE 875-125 MG PO TABS: 1.0000 | ORAL_TABLET | Freq: Two times a day (BID) | ORAL | 0 refills | Status: AC

## 2021-02-05 SURGICAL SUPPLY — 40 items
APL SKNCLS STERI-STRIP NONHPOA (GAUZE/BANDAGES/DRESSINGS)
BENZOIN TINCTURE PRP APPL 2/3 (GAUZE/BANDAGES/DRESSINGS) IMPLANT
BNDG CMPR 75X41 PLY HI ABS (GAUZE/BANDAGES/DRESSINGS) ×1
BNDG CMPR STD VLCR NS LF 5.8X4 (GAUZE/BANDAGES/DRESSINGS) ×1
BNDG COHESIVE 4X5 TAN STRL (GAUZE/BANDAGES/DRESSINGS) IMPLANT
BNDG ELASTIC 4X5.8 VLCR NS LF (GAUZE/BANDAGES/DRESSINGS) ×2 IMPLANT
BNDG ESMARK 4X12 TAN STRL LF (GAUZE/BANDAGES/DRESSINGS) ×2 IMPLANT
BNDG STRETCH 4X75 STRL LF (GAUZE/BANDAGES/DRESSINGS) ×2 IMPLANT
CANISTER SUCT 1200ML W/VALVE (MISCELLANEOUS) ×2 IMPLANT
COVER LIGHT HANDLE FLEXIBLE (MISCELLANEOUS) ×4 IMPLANT
CUFF TOURN SGL QUICK 18X4 (TOURNIQUET CUFF) ×2 IMPLANT
DURAPREP 26ML APPLICATOR (WOUND CARE) ×2 IMPLANT
ELECT REM PT RETURN 9FT ADLT (ELECTROSURGICAL) ×2
ELECTRODE REM PT RTRN 9FT ADLT (ELECTROSURGICAL) ×1 IMPLANT
GAUZE SPONGE 4X4 12PLY STRL (GAUZE/BANDAGES/DRESSINGS) ×2 IMPLANT
GAUZE XEROFORM 1X8 LF (GAUZE/BANDAGES/DRESSINGS) ×2 IMPLANT
GLOVE SURG ENC MOIS LTX SZ7 (GLOVE) ×2 IMPLANT
GLOVE SURG POLYISO LF SZ7 (GLOVE) ×2 IMPLANT
GOWN STRL REUS W/ TWL LRG LVL3 (GOWN DISPOSABLE) ×2 IMPLANT
GOWN STRL REUS W/TWL LRG LVL3 (GOWN DISPOSABLE) ×4
KIT TURNOVER KIT A (KITS) ×2 IMPLANT
NS IRRIG 500ML POUR BTL (IV SOLUTION) ×2 IMPLANT
PACK EXTREMITY ARMC (MISCELLANEOUS) ×2 IMPLANT
PENCIL SMOKE EVACUATOR (MISCELLANEOUS) ×2 IMPLANT
STOCKINETTE IMPERVIOUS LG (DRAPES) ×2 IMPLANT
STRIP CLOSURE SKIN 1/4X4 (GAUZE/BANDAGES/DRESSINGS) ×2 IMPLANT
SUT ETHILON 3-0 FS-10 30 BLK (SUTURE) ×2
SUT ETHILON 4-0 (SUTURE)
SUT ETHILON 4-0 FS2 18XMFL BLK (SUTURE)
SUT ETHILON 5-0 FS-2 18 BLK (SUTURE) IMPLANT
SUT MNCRL 4-0 (SUTURE)
SUT MNCRL 4-0 27XMFL (SUTURE)
SUT VIC AB 2-0 SH 27 (SUTURE)
SUT VIC AB 2-0 SH 27XBRD (SUTURE) IMPLANT
SUT VIC AB 3-0 SH 27 (SUTURE)
SUT VIC AB 3-0 SH 27X BRD (SUTURE) IMPLANT
SUT VIC AB 4-0 FS2 27 (SUTURE) ×2 IMPLANT
SUTURE EHLN 3-0 FS-10 30 BLK (SUTURE) ×1 IMPLANT
SUTURE ETHLN 4-0 FS2 18XMF BLK (SUTURE) IMPLANT
SUTURE MNCRL 4-0 27XMF (SUTURE) IMPLANT

## 2021-02-05 NOTE — Anesthesia Preprocedure Evaluation (Signed)
Anesthesia Evaluation  Patient identified by MRN, date of birth, ID band Patient awake    Reviewed: Allergy & Precautions, H&P , NPO status , Patient's Chart, lab work & pertinent test results  Airway Mallampati: II  TM Distance: >3 FB Neck ROM: full    Dental no notable dental hx.    Pulmonary    Pulmonary exam normal breath sounds clear to auscultation       Cardiovascular hypertension, Normal cardiovascular exam Rhythm:regular Rate:Normal     Neuro/Psych    GI/Hepatic   Endo/Other  diabetes  Renal/GU      Musculoskeletal   Abdominal   Peds  Hematology   Anesthesia Other Findings   Reproductive/Obstetrics                             Anesthesia Physical Anesthesia Plan  ASA: 2  Anesthesia Plan: General LMA   Post-op Pain Management: Minimal or no pain anticipated   Induction:   PONV Risk Score and Plan: 2 and Treatment may vary due to age or medical condition, Ondansetron and Dexamethasone  Airway Management Planned:   Additional Equipment:   Intra-op Plan:   Post-operative Plan:   Informed Consent: I have reviewed the patients History and Physical, chart, labs and discussed the procedure including the risks, benefits and alternatives for the proposed anesthesia with the patient or authorized representative who has indicated his/her understanding and acceptance.     Dental Advisory Given  Plan Discussed with: CRNA  Anesthesia Plan Comments:         Anesthesia Quick Evaluation

## 2021-02-05 NOTE — Anesthesia Procedure Notes (Signed)
Procedure Name: LMA Insertion Date/Time: 02/05/2021 10:00 AM Performed by: Michaele Offer, CRNA Pre-anesthesia Checklist: Patient identified, Emergency Drugs available, Suction available, Patient being monitored and Timeout performed Patient Re-evaluated:Patient Re-evaluated prior to induction Oxygen Delivery Method: Circle system utilized Preoxygenation: Pre-oxygenation with 100% oxygen Induction Type: IV induction Ventilation: Mask ventilation without difficulty LMA: LMA inserted LMA Size: 4.0 Number of attempts: 1 Placement Confirmation: positive ETCO2 and breath sounds checked- equal and bilateral Tube secured with: Tape Dental Injury: Teeth and Oropharynx as per pre-operative assessment

## 2021-02-05 NOTE — H&P (Signed)
.  HISTORY AND PHYSICAL INTERVAL NOTE:  02/05/2021  9:46 AM  Dennis Gray  has presented today for surgery, with the diagnosis of M67.47 - Ganglion Cyst.  The various methods of treatment have been discussed with the patient.  No guarantees were given.  After consideration of risks, benefits and other options for treatment, the patient has consented to surgery.  I have reviewed the patients' chart and labs.    PROCEDURE: LEFT FOOT GANGLION CYST RESECTION  A history and physical examination was performed in my office.  The patient was reexamined.  There have been no changes to this history and physical examination.  Rosetta Posner, DPM

## 2021-02-05 NOTE — Anesthesia Postprocedure Evaluation (Signed)
Anesthesia Post Note  Patient: Dennis Gray  Procedure(s) Performed: REMOVAL GANGLION CYST LEFT FOOT (Left: Foot)     Patient location during evaluation: PACU Anesthesia Type: General Level of consciousness: awake and alert and oriented Pain management: satisfactory to patient Vital Signs Assessment: post-procedure vital signs reviewed and stable Respiratory status: spontaneous breathing, nonlabored ventilation and respiratory function stable Cardiovascular status: blood pressure returned to baseline and stable Postop Assessment: Adequate PO intake and No signs of nausea or vomiting Anesthetic complications: no   No notable events documented.  Cherly [place]

## 2021-02-05 NOTE — Op Note (Signed)
PODIATRY / FOOT AND ANKLE SURGERY OPERATIVE REPORT    SURGEON: Caroline More, DPM  PRE-OPERATIVE DIAGNOSIS:  1.  Left ganglion cyst  POST-OPERATIVE DIAGNOSIS: Same  PROCEDURE(S): Left ganglion cyst resection  HEMOSTASIS: Left ankle tourniquet  ANESTHESIA: MAC  ESTIMATED BLOOD LOSS: 5 cc  FINDING(S): 1.  Left foot ganglion cyst coming from the peroneal tendon sheath near the fifth metatarsal base area around the insertion extending slightly proximal.  PATHOLOGY/SPECIMEN(S): Left foot ganglion cyst  INDICATIONS:   Dennis Gray is a 40 y.o. male who presents with a chronic recurring and painful cyst that is formed to the dorsal lateral aspect of the left foot.  Patient has had this aspirated and states that it comes and goes.  Patient's aspiration did reveal it to be a ganglion cyst.  Patient continues to have pain discomfort despite aspirations and other conservative measures.  At this time patient is elected for surgical intervention which he presents for today.  All treatment options were discussed with the patient both conservative and surgical attempts at correction clean potential risks and complications at this time patient is elected for surgical intervention consisting of left foot cyst resection.  Discussed high recurrence rate with patient in detail, no guarantees given.  Consent obtained prior to procedure.  DESCRIPTION: After obtaining full informed written consent, the patient was brought back to the operating room and placed supine upon the operating table.  The patient received IV antibiotics prior to induction.  After obtaining adequate anesthesia, 10 cc of 1% lidocaine plain was injected about the lateral ankle area and sural nerve to block the nerves in the area of the cyst.  The patient was prepped and draped in the standard fashion.  An Esmarch bandage was used to exsanguinate the left lower extremity and the pneumatic ankle tourniquet was inflated.  Attention  was directed to the dorsal lateral aspect the left foot where a linear longitudinal incision was made over the top of the cyst near the fifth metatarsal base area extending slightly proximal over the area of the palpable mass.  The incision was deepened to the subcutaneous tissues utilizing sharp and blunt dissection and care was taken to identify and retract all vital neurovascular structures all venous contributories were cauterized as necessary.  At this time the ganglion cyst was able to be visualized and appeared to be coming from the peroneal tendon course.  Circumferential dissection was performed around the cyst taking care not to pop the cyst.  The stalk was able to be located coming off the fifth metatarsal base area near the peroneal tendon insertion point and appeared to be going slightly proximal to this as well along the peroneal tendon course.  The stalk was cut and the cyst was passed off in the operative site and opened revealing gelatinous fluid consistent with ganglion cyst.  The cyst measured approximately 1.5 cm x 1.8 cm.  Any residual cystic type of debris was resected or debrided and passed off the operative site in the area.  The stalk was also cauterized to try to prevent recurrence.  The surgical site was flushed with copious amounts normal sterile saline.  The subcutaneous tissue was then reapproximated well coapted with 4-0 Vicryl and the skin was then reapproximated well coapted with 3-0 nylon in a combination of simple and horizontal mattress type stitching.  An additional 10 cc of half percent Marcaine plain was injected about the field and the area of the surgical site.  A postoperative dressing was applied  consisting of Xeroform followed by 4 x 4 gauze, Kerlix, Ace wrap.  The pneumatic ankle tourniquet was deflated and a prompt hyperemic response was noted all digits left foot.  The patient tolerated the procedure and anesthesia well was transferred to recovery room vital signs  stable vascular status intact all toes left foot.  Patient will be discharged with the appropriate orders and follow-up instructions.  Patient is to be partial weightbearing with use of surgical shoe.  Patient to follow-up in clinic within 1 week of surgical date next week.  COMPLICATIONS: None  CONDITION: Good, stable  Caroline More, DPM

## 2021-02-05 NOTE — Transfer of Care (Signed)
Immediate Anesthesia Transfer of Care Note  Patient: Dennis Gray  Procedure(s) Performed: REMOVAL GANGLION CYST LEFT FOOT (Left: Foot)  Patient Location: PACU  Anesthesia Type: General LMA  Level of Consciousness: awake, alert  and patient cooperative  Airway and Oxygen Therapy: Patient Spontanous Breathing and Patient connected to supplemental oxygen  Post-op Assessment: Post-op Vital signs reviewed, Patient's Cardiovascular Status Stable, Respiratory Function Stable, Patent Airway and No signs of Nausea or vomiting  Post-op Vital Signs: Reviewed and stable  Complications: No notable events documented.

## 2021-02-06 ENCOUNTER — Encounter: Payer: Self-pay | Admitting: Podiatry

## 2021-03-11 DIAGNOSIS — R202 Paresthesia of skin: Secondary | ICD-10-CM | POA: Insufficient documentation

## 2021-05-14 ENCOUNTER — Other Ambulatory Visit: Payer: Self-pay

## 2021-05-14 ENCOUNTER — Encounter: Payer: Self-pay | Admitting: Podiatry

## 2021-05-14 ENCOUNTER — Ambulatory Visit: Payer: No Typology Code available for payment source | Admitting: Podiatry

## 2021-05-14 DIAGNOSIS — M792 Neuralgia and neuritis, unspecified: Secondary | ICD-10-CM | POA: Diagnosis not present

## 2021-05-14 MED ORDER — PREGABALIN 75 MG PO CAPS: 75.0000 mg | ORAL_CAPSULE | Freq: Two times a day (BID) | ORAL | 0 refills | Status: AC

## 2021-05-19 NOTE — Progress Notes (Signed)
?Subjective:  ?Patient ID: Dennis Gray, male    DOB: 1980/03/19,  MRN: [id number] ? ?Chief Complaint  ?Patient presents with  ? Cyst  ? ? ?41 y.o. male presents with the above complaint.  Patient presents with complaint of left lateral foot pain.  Patient had a cyst removed on the lateral side of the foot by [facility] clinic.  Patient states that has been going since his surgery.  He states the surgery was few months ago.  This is is causing some pain and numbness at the incision site there is burning sensation.  He is tried gabapentin which helps.  He denies any other acute complaints.  He has not seen anyone else prior to seeing me.  He is presents with a Spanish interpreter ? ? ?Review of Systems: Negative except as noted in the HPI. Denies N/V/F/Ch. ? ?Past Medical History:  ?Diagnosis Date  ? Diabetes mellitus without complication (HCC)   ? Hypertension   ? ? ?Current Outpatient Medications:  ?  atorvastatin (LIPITOR) 20 MG tablet, , Disp: , Rfl:  ?  gabapentin (NEURONTIN) 100 MG capsule, 100 mg twice daily for a week, then increase to 200 mg twice daily for a week, then can increase to 300 mg twice daily., Disp: , Rfl:  ?  hydrOXYzine (ATARAX) 25 MG tablet, hydroxyzine HCl 25 mg tablet, Disp: , Rfl:  ?  lisinopril (ZESTRIL) 2.5 MG tablet, , Disp: , Rfl:  ?  pregabalin (LYRICA) 75 MG capsule, Take 1 capsule (75 mg total) by mouth 2 (two) times daily., Disp: 60 capsule, Rfl: 0 ?  fluconazole (DIFLUCAN) 150 MG tablet, , Disp: , Rfl:  ?  gabapentin (NEURONTIN) 300 MG capsule, Take 1 capsule by mouth 3 (three) times daily. (Patient not taking: Reported on 01/30/2021), Disp: , Rfl:  ?  glipiZIDE (GLUCOTROL) 10 MG tablet, Take by mouth. (Patient not taking: Reported on 01/30/2021), Disp: , Rfl:  ?  glipiZIDE (GLUCOTROL) 5 MG tablet, Take 10 mg by mouth daily., Disp: , Rfl:  ?  insulin aspart (NOVOLOG) 100 UNIT/ML injection, Inject 25 Units into the skin at bedtime.  (Patient not taking: Reported on 01/30/2021),  Disp: , Rfl:  ?  insulin detemir (LEVEMIR) 100 UNIT/ML injection, Inject 25 Units into the skin daily. (Patient not taking: Reported on 01/30/2021), Disp: , Rfl:  ?  insulin glargine (LANTUS) 100 UNIT/ML injection, Inject 22-25 Units into the skin daily., Disp: , Rfl:  ?  JARDIANCE 10 MG TABS tablet, , Disp: , Rfl:  ?  metFORMIN (GLUCOPHAGE) 500 MG tablet, Take by mouth., Disp: , Rfl:  ?  metFORMIN (GLUMETZA) 1000 MG (MOD) 24 hr tablet, Take 1 tablet by mouth 2 (two) times a day. (Patient not taking: Reported on 01/30/2021), Disp: , Rfl:  ?  methocarbamol (ROBAXIN) 500 MG tablet, TAKE 1 TABLET BY MOUTH TWICE A DAY (Patient not taking: Reported on 01/30/2021), Disp: , Rfl:  ?  predniSONE (DELTASONE) 5 MG tablet, , Disp: , Rfl:  ?  TRULICITY 0.75 MG/0.5ML SOPN, once a week., Disp: , Rfl:  ? ?Social History  ? ?Tobacco Use  ?Smoking Status Never  ?Smokeless Tobacco Never  ? ? ?No Known Allergies ?Objective:  ?There were no vitals filed for this visit. ?There is no height or weight on file to calculate BMI. ?Constitutional Well developed. ?Well nourished.  ?Vascular Dorsalis pedis pulses palpable bilaterally. ?Posterior tibial pulses palpable bilaterally. ?Capillary refill normal to all digits.  ?No cyanosis or clubbing noted. ?Pedal hair growth normal.  ?  Neurologic Normal speech. ?Oriented to person, place, and time. ?Epicritic sensation to light touch grossly present bilaterally.  ?Dermatologic Nails well groomed and normal in appearance. ?No open wounds. ?No skin lesions.  ?Orthopedic: Positive Tinel's sign near the superficial peroneal nerve on the lateral side of the left foot.  Shooting tingling noted above and below the nerve.  Near the incision site.  Patient may have had a nerve entrapment likely due to the cyst that was removed.  No signs of recurrence of cyst noted at this time.  ? ?Radiographs: None ?Assessment:  ? ?1. Neuritis   ? ?Plan:  ?Patient was evaluated and treated and all questions answered. ? 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 ?-All questions and concerns were discussed with the patient in extensive detail. ?-Given that this is postsurgical area it can take about a year for the nerve to regain control and decrease shooting tingling I discussed this with the patient he states understanding.  If there is no improvement in a year I will ask him to come see me and we can discuss doing nerve releases if needed.  I discussed this with the patient in extensive detail he states understanding. ?-Lyrica was dispensed ? ?No follow-ups on file.  ?

## 2021-09-10 ENCOUNTER — Other Ambulatory Visit: Payer: Self-pay | Admitting: Nurse Practitioner

## 2021-09-10 DIAGNOSIS — R197 Diarrhea, unspecified: Secondary | ICD-10-CM

## 2021-09-10 DIAGNOSIS — R1032 Left lower quadrant pain: Secondary | ICD-10-CM

## 2021-09-10 DIAGNOSIS — K625 Hemorrhage of anus and rectum: Secondary | ICD-10-CM

## 2021-09-11 ENCOUNTER — Ambulatory Visit
Admission: RE | Admit: 2021-09-11 | Discharge: 2021-09-11 | Disposition: A | Discharge status: Home / Self Care | Payer: PRIVATE HEALTH INSURANCE | Source: Ambulatory Visit | Attending: Nurse Practitioner | Admitting: Nurse Practitioner

## 2021-09-11 DIAGNOSIS — R197 Diarrhea, unspecified: Secondary | ICD-10-CM | POA: Insufficient documentation

## 2021-09-11 DIAGNOSIS — K625 Hemorrhage of anus and rectum: Secondary | ICD-10-CM | POA: Diagnosis present

## 2021-09-11 DIAGNOSIS — R1032 Left lower quadrant pain: Secondary | ICD-10-CM | POA: Diagnosis present

## 2021-09-11 DIAGNOSIS — R1031 Right lower quadrant pain: Secondary | ICD-10-CM | POA: Insufficient documentation

## 2021-09-11 LAB — POCT I-STAT CREATININE: Creatinine, Ser: 0.7 mg/dL (ref 0.61–1.24)

## 2021-09-11 MED ORDER — IOHEXOL 300 MG/ML  SOLN
100.0000 mL | Freq: Once | INTRAMUSCULAR | Status: AC | PRN
  Administered 2021-09-11: 100 mL via INTRAVENOUS

## 2023-03-30 ENCOUNTER — Ambulatory Visit: Payer: No Typology Code available for payment source

## 2023-03-30 DIAGNOSIS — K5730 Diverticulosis of large intestine without perforation or abscess without bleeding: Secondary | ICD-10-CM | POA: Diagnosis not present

## 2023-03-30 DIAGNOSIS — Z1211 Encounter for screening for malignant neoplasm of colon: Secondary | ICD-10-CM | POA: Diagnosis present

## 2023-03-30 DIAGNOSIS — Z83719 Family history of colon polyps, unspecified: Secondary | ICD-10-CM | POA: Diagnosis not present

## 2023-03-30 DIAGNOSIS — K640 First degree hemorrhoids: Secondary | ICD-10-CM | POA: Diagnosis not present
